# Patient Record
Sex: Female | Born: 1940
Health system: Southern US, Community
[De-identification: ages and names within clinical notes are randomized; demographics above are authoritative.]

## PROBLEM LIST (undated history)

## (undated) DIAGNOSIS — E7800 Pure hypercholesterolemia, unspecified: Secondary | ICD-10-CM

## (undated) DIAGNOSIS — I10 Essential (primary) hypertension: Secondary | ICD-10-CM

## (undated) HISTORY — PX: TUBAL LIGATION: SHX77

---

## 2005-12-21 ENCOUNTER — Other Ambulatory Visit: Payer: Self-pay

## 2005-12-21 ENCOUNTER — Emergency Department: Payer: Self-pay | Admitting: Unknown Physician Specialty

## 2012-04-07 ENCOUNTER — Ambulatory Visit: Payer: Self-pay | Admitting: Emergency Medicine

## 2012-04-21 ENCOUNTER — Telehealth: Payer: Self-pay

## 2012-04-21 ENCOUNTER — Other Ambulatory Visit: Payer: Self-pay

## 2012-04-21 DIAGNOSIS — R932 Abnormal findings on diagnostic imaging of liver and biliary tract: Secondary | ICD-10-CM

## 2012-04-21 NOTE — Telephone Encounter (Signed)
Pt has been instructed and meds reviewed she was just started on Metformin yesterday and I advised her to NOT take it on the day of the procedure.  Pt agreed and will call with any questions or concerns.  Instructions were mailed to the home

## 2012-04-21 NOTE — Telephone Encounter (Signed)
Pt has been scheduled for EUS needs to be instructed and meds reviewed  Left message on machine to call back

## 2012-05-04 ENCOUNTER — Ambulatory Visit (HOSPITAL_COMMUNITY)
Admission: RE | Admit: 2012-05-04 | Discharge: 2012-05-04 | Disposition: A | Discharge status: Home Health | Payer: Medicare Other | Source: Ambulatory Visit | Attending: Gastroenterology | Admitting: Gastroenterology

## 2012-05-04 ENCOUNTER — Encounter (HOSPITAL_COMMUNITY): Payer: Self-pay

## 2012-05-04 ENCOUNTER — Encounter (HOSPITAL_COMMUNITY): Payer: Self-pay | Admitting: *Deleted

## 2012-05-04 ENCOUNTER — Encounter (HOSPITAL_COMMUNITY)
Admission: RE | Disposition: A | Discharge status: Home Health | Payer: Self-pay | Source: Ambulatory Visit | Attending: Gastroenterology

## 2012-05-04 ENCOUNTER — Ambulatory Visit (HOSPITAL_COMMUNITY): Payer: Medicare Other | Admitting: *Deleted

## 2012-05-04 DIAGNOSIS — R1110 Vomiting, unspecified: Secondary | ICD-10-CM | POA: Insufficient documentation

## 2012-05-04 DIAGNOSIS — R197 Diarrhea, unspecified: Secondary | ICD-10-CM | POA: Insufficient documentation

## 2012-05-04 DIAGNOSIS — E119 Type 2 diabetes mellitus without complications: Secondary | ICD-10-CM | POA: Insufficient documentation

## 2012-05-04 DIAGNOSIS — E7800 Pure hypercholesterolemia, unspecified: Secondary | ICD-10-CM | POA: Insufficient documentation

## 2012-05-04 DIAGNOSIS — R109 Unspecified abdominal pain: Secondary | ICD-10-CM | POA: Insufficient documentation

## 2012-05-04 DIAGNOSIS — R932 Abnormal findings on diagnostic imaging of liver and biliary tract: Secondary | ICD-10-CM | POA: Insufficient documentation

## 2012-05-04 DIAGNOSIS — R933 Abnormal findings on diagnostic imaging of other parts of digestive tract: Secondary | ICD-10-CM

## 2012-05-04 DIAGNOSIS — I10 Essential (primary) hypertension: Secondary | ICD-10-CM | POA: Insufficient documentation

## 2012-05-04 HISTORY — DX: Pure hypercholesterolemia, unspecified: E78.00

## 2012-05-04 HISTORY — PX: EUS: SHX5427

## 2012-05-04 LAB — GLUCOSE, CAPILLARY: Glucose-Capillary: 97 mg/dL (ref 70–99)

## 2012-05-04 SURGERY — UPPER ENDOSCOPIC ULTRASOUND (EUS) LINEAR
Anesthesia: Monitor Anesthesia Care

## 2012-05-04 MED ORDER — FENTANYL CITRATE 0.05 MG/ML IJ SOLN
INTRAMUSCULAR | Status: DC | PRN
  Administered 2012-05-04: 50 ug via INTRAVENOUS

## 2012-05-04 MED ORDER — KETAMINE HCL 10 MG/ML IJ SOLN
INTRAMUSCULAR | Status: DC | PRN
  Administered 2012-05-04: 10 mg via INTRAVENOUS

## 2012-05-04 MED ORDER — PROPOFOL 10 MG/ML IV EMUL
INTRAVENOUS | Status: DC | PRN
  Administered 2012-05-04: 160 ug/kg/min via INTRAVENOUS

## 2012-05-04 MED ORDER — ONDANSETRON HCL 4 MG/2ML IJ SOLN
INTRAMUSCULAR | Status: DC | PRN
  Administered 2012-05-04: 4 mg via INTRAVENOUS

## 2012-05-04 MED ORDER — MIDAZOLAM HCL 5 MG/5ML IJ SOLN
INTRAMUSCULAR | Status: DC | PRN
  Administered 2012-05-04 (×2): 1 mg via INTRAVENOUS

## 2012-05-04 MED ORDER — LACTATED RINGERS IV SOLN
INTRAVENOUS | Status: DC | PRN
  Administered 2012-05-04: 13:00:00 via INTRAVENOUS

## 2012-05-04 NOTE — Anesthesia Postprocedure Evaluation (Signed)
Anesthesia Post Note  Patient: Joan Brady  Procedure(s) Performed: Procedure(s) (LRB): UPPER ENDOSCOPIC ULTRASOUND (EUS) LINEAR (N/A)  Anesthesia type: MAC  Patient location: PACU  Post pain: Pain level controlled  Post assessment: Post-op Vital signs reviewed  Last Vitals: BP 141/72  Pulse 72  Temp 36.7 C  Resp 13  Ht 5\' 3"  (1.6 m)  Wt 140 lb (63.504 kg)  BMI 24.80 kg/m2  SpO2 100%  Post vital signs: Reviewed  Level of consciousness: awake  Complications: No apparent anesthesia complications

## 2012-05-04 NOTE — Transfer of Care (Signed)
Immediate Anesthesia Transfer of Care Note  Patient: Joan Brady  Procedure(s) Performed: Procedure(s) (LRB): UPPER ENDOSCOPIC ULTRASOUND (EUS) LINEAR (N/A)  Patient Location: PACU and Endoscopy Unit  Anesthesia Type: MAC  Level of Consciousness: awake, alert , oriented and patient cooperative  Airway & Oxygen Therapy: Patient Spontanous Breathing and Patient connected to nasal cannula oxygen  Post-op Assessment: Report given to PACU RN, Post -op Vital signs reviewed and stable and Patient moving all extremities  Post vital signs: Reviewed and stable  Complications: No apparent anesthesia complications

## 2012-05-04 NOTE — Preoperative (Signed)
Beta Blockers   Reason not to administer Beta Blockers:Not Applicable 

## 2012-05-04 NOTE — Anesthesia Preprocedure Evaluation (Addendum)
Anesthesia Evaluation  Patient identified by MRN, date of birth, ID band Patient awake    Reviewed: Allergy & Precautions, H&P , NPO status , Patient's Chart, lab work & pertinent test results  Airway Mallampati: II TM Distance: >3 FB Neck ROM: Full    Dental  (+) Edentulous Upper and Edentulous Lower   Pulmonary Current Smoker,  breath sounds clear to auscultation  Pulmonary exam normal       Cardiovascular Exercise Tolerance: Good hypertension, Pt. on medications Rhythm:Regular Rate:Normal     Neuro/Psych negative neurological ROS  negative psych ROS   GI/Hepatic negative GI ROS, Neg liver ROS,   Endo/Other  Type 2  Renal/GU negative Renal ROS     Musculoskeletal   Abdominal (+)  Abdomen: soft.    Peds  Hematology   Anesthesia Other Findings   Reproductive/Obstetrics                         Anesthesia Physical Anesthesia Plan  ASA: III  Anesthesia Plan: MAC   Post-op Pain Management:    Induction: Intravenous  Airway Management Planned: Simple Face Mask  Additional Equipment:   Intra-op Plan:   Post-operative Plan:   Informed Consent: I have reviewed the patients History and Physical, chart, labs and discussed the procedure including the risks, benefits and alternatives for the proposed anesthesia with the patient or authorized representative who has indicated his/her understanding and acceptance.   Dental advisory given  Plan Discussed with: CRNA and Surgeon  Anesthesia Plan Comments:         Anesthesia Quick Evaluation

## 2012-05-04 NOTE — H&P (Signed)
  HPI: This is a with abd pain, diarhrea, vomiting that has all improved since changing her DM meds.  Workup revealed "potentially abnormal pancreas" on outside CT and MRI    Past Medical History  Diagnosis Date  . Hypercholesteremia   . Diabetes mellitus     Past Surgical History  Procedure Date  . Tubal ligation     No current facility-administered medications for this encounter.   Facility-Administered Medications Ordered in Other Encounters  Medication Dose Route Frequency Provider Last Rate Last Dose  . lactated ringers infusion    Continuous PRN Edison Pace, CRNA        Allergies as of 04/21/2012  . (Not on File)    History reviewed. No pertinent family history.  History   Social History  . Marital Status: Divorced    Spouse Name: N/A    Number of Children: N/A  . Years of Education: N/A   Occupational History  . Not on file.   Social History Main Topics  . Smoking status: Current Everyday Smoker -- 0.5 packs/day  . Smokeless tobacco: Not on file  . Alcohol Use: No  . Drug Use: No  . Sexually Active:    Other Topics Concern  . Not on file   Social History Narrative  . No narrative on file      Physical Exam: BP 155/84  Pulse 72  Temp 98 F (36.7 C)  Resp 16  Ht 5\' 3"  (1.6 m)  Wt 140 lb (63.504 kg)  BMI 24.80 kg/m2  SpO2 99% Constitutional: generally well-appearing Psychiatric: alert and oriented x3 Abdomen: soft, nontender, nondistended, no obvious ascites, no peritoneal signs, normal bowel sounds     Assessment and plan: 71 y.o. female with ? Abnormal pancreas on outside imaging  For EUS today

## 2012-05-04 NOTE — Op Note (Signed)
[facility] [address] [place], [place]  [address]  ENDOSCOPIC ULTRASOUND PROCEDURE REPORT  PATIENT:  Joan Brady, Joan Brady  MR#:  [id number] BIRTHDATE:  08/05/1941  GENDER:  female ENDOSCOPIST:  Rachael Fee, MD REFERRING:   Dr. Niel Hummer at [facility] in [place], [place] PROCEDURE DATE:  05/04/2012 PROCEDURE:  Upper EUS ASA CLASS:  Class II INDICATIONS:  recent GI symptoms occuring after DM medicine adjustments, have improved; workup during that revealed abnormal pancreas on CT, MRI (outside).  No history of pancreatic disease, no FH of pancreatic disease, never etoh drinker MEDICATIONS:   MAC sedation, administered by CRNA  DESCRIPTION OF PROCEDURE:   After the risks benefits and alternatives of the procedure were  explained, informed consent was obtained. The patient was then placed in the left, lateral, decubitus postion and IV sedation was administered. Throughout the procedure, the patient's blood pressure, pulse and oxygen saturations were monitored continuously.  Under direct visualization, the 110036 endoscope was introduced through the mouth and advanced to the second portion of the duodenum.  Water was used as necessary to provide an acoustic interface.  Upon completion of the imaging, water was removed and the patient was sent to the recovery room in satisfactory condition. <<PROCEDUREIMAGES>>  Endoscopic findings: 1. Normal UGI tract  EUS findings: 1. Pancreaic parenchyma was normal throughout gland; no discrete masses, no signs of chronic pancreatitis 2. Main pancreatic duct was normal 3. No peripanceatic adenopathy 4. Gallbladder was normal 5. CBD was non-dilated and contained no stone 6. Limited views of liver, spleen, portal and splenic vessels were all normal  Impression: Normal examination of pancreas.  The GI symptoms that led to initial evaluation have all subsided and may have been related to DM medicine  adjustments.  ______________________________ Rachael Fee, MD  n. eSIGNED:   Rachael Fee at 05/04/2012 02:59 PM  Barnet Pall, [phone]

## 2012-05-05 ENCOUNTER — Encounter (HOSPITAL_COMMUNITY): Payer: Self-pay | Admitting: Gastroenterology

## 2014-10-23 DIAGNOSIS — F17211 Nicotine dependence, cigarettes, in remission: Secondary | ICD-10-CM | POA: Diagnosis not present

## 2014-10-23 DIAGNOSIS — Z9114 Patient's other noncompliance with medication regimen: Secondary | ICD-10-CM | POA: Diagnosis not present

## 2014-10-23 DIAGNOSIS — E1165 Type 2 diabetes mellitus with hyperglycemia: Secondary | ICD-10-CM | POA: Diagnosis not present

## 2014-10-23 DIAGNOSIS — E781 Pure hyperglyceridemia: Secondary | ICD-10-CM | POA: Diagnosis not present

## 2014-10-23 DIAGNOSIS — I10 Essential (primary) hypertension: Secondary | ICD-10-CM | POA: Diagnosis not present

## 2015-02-19 DIAGNOSIS — H524 Presbyopia: Secondary | ICD-10-CM | POA: Diagnosis not present

## 2015-02-19 DIAGNOSIS — H5210 Myopia, unspecified eye: Secondary | ICD-10-CM | POA: Diagnosis not present

## 2015-05-12 ENCOUNTER — Encounter: Payer: Self-pay | Admitting: Family Medicine

## 2015-06-12 ENCOUNTER — Encounter: Payer: Self-pay | Admitting: Family Medicine

## 2015-07-02 ENCOUNTER — Encounter: Payer: Self-pay | Admitting: Family Medicine

## 2015-08-15 ENCOUNTER — Other Ambulatory Visit: Payer: Self-pay | Admitting: Family Medicine

## 2015-08-21 ENCOUNTER — Other Ambulatory Visit: Payer: Self-pay | Admitting: Family Medicine

## 2015-08-28 DIAGNOSIS — I10 Essential (primary) hypertension: Secondary | ICD-10-CM | POA: Diagnosis not present

## 2015-08-28 DIAGNOSIS — E7800 Pure hypercholesterolemia, unspecified: Secondary | ICD-10-CM | POA: Diagnosis not present

## 2015-08-28 DIAGNOSIS — Z23 Encounter for immunization: Secondary | ICD-10-CM | POA: Diagnosis not present

## 2015-08-28 DIAGNOSIS — E1165 Type 2 diabetes mellitus with hyperglycemia: Secondary | ICD-10-CM | POA: Diagnosis not present

## 2015-09-02 DIAGNOSIS — E1165 Type 2 diabetes mellitus with hyperglycemia: Secondary | ICD-10-CM | POA: Diagnosis not present

## 2015-09-02 DIAGNOSIS — E7800 Pure hypercholesterolemia, unspecified: Secondary | ICD-10-CM | POA: Diagnosis not present

## 2015-09-02 DIAGNOSIS — I10 Essential (primary) hypertension: Secondary | ICD-10-CM | POA: Diagnosis not present

## 2016-03-04 DIAGNOSIS — E1165 Type 2 diabetes mellitus with hyperglycemia: Secondary | ICD-10-CM | POA: Diagnosis not present

## 2016-03-04 DIAGNOSIS — I10 Essential (primary) hypertension: Secondary | ICD-10-CM | POA: Diagnosis not present

## 2016-03-04 DIAGNOSIS — E7800 Pure hypercholesterolemia, unspecified: Secondary | ICD-10-CM | POA: Diagnosis not present

## 2016-03-04 DIAGNOSIS — Z79899 Other long term (current) drug therapy: Secondary | ICD-10-CM | POA: Diagnosis not present

## 2016-11-22 DIAGNOSIS — Z79899 Other long term (current) drug therapy: Secondary | ICD-10-CM | POA: Diagnosis not present

## 2016-11-22 DIAGNOSIS — E1165 Type 2 diabetes mellitus with hyperglycemia: Secondary | ICD-10-CM | POA: Diagnosis not present

## 2016-11-22 DIAGNOSIS — E7800 Pure hypercholesterolemia, unspecified: Secondary | ICD-10-CM | POA: Diagnosis not present

## 2016-11-22 DIAGNOSIS — I10 Essential (primary) hypertension: Secondary | ICD-10-CM | POA: Diagnosis not present

## 2017-05-18 DIAGNOSIS — Z0000 Encounter for general adult medical examination without abnormal findings: Secondary | ICD-10-CM | POA: Diagnosis not present

## 2017-05-18 DIAGNOSIS — E1165 Type 2 diabetes mellitus with hyperglycemia: Secondary | ICD-10-CM | POA: Diagnosis not present

## 2017-05-18 DIAGNOSIS — Z79899 Other long term (current) drug therapy: Secondary | ICD-10-CM | POA: Diagnosis not present

## 2017-12-01 DIAGNOSIS — R809 Proteinuria, unspecified: Secondary | ICD-10-CM | POA: Diagnosis not present

## 2017-12-01 DIAGNOSIS — E7800 Pure hypercholesterolemia, unspecified: Secondary | ICD-10-CM | POA: Diagnosis not present

## 2017-12-01 DIAGNOSIS — I10 Essential (primary) hypertension: Secondary | ICD-10-CM | POA: Diagnosis not present

## 2017-12-01 DIAGNOSIS — Z23 Encounter for immunization: Secondary | ICD-10-CM | POA: Diagnosis not present

## 2017-12-01 DIAGNOSIS — Z79899 Other long term (current) drug therapy: Secondary | ICD-10-CM | POA: Diagnosis not present

## 2017-12-01 DIAGNOSIS — E1129 Type 2 diabetes mellitus with other diabetic kidney complication: Secondary | ICD-10-CM | POA: Diagnosis not present

## 2017-12-01 DIAGNOSIS — E663 Overweight: Secondary | ICD-10-CM | POA: Diagnosis not present

## 2017-12-02 DIAGNOSIS — E119 Type 2 diabetes mellitus without complications: Secondary | ICD-10-CM | POA: Diagnosis not present

## 2017-12-08 ENCOUNTER — Inpatient Hospital Stay
Admission: EM | Admit: 2017-12-08 | Discharge: 2017-12-10 | DRG: 069 | Disposition: A | Discharge status: Home / Self Care | Payer: Medicare HMO | Attending: Internal Medicine | Admitting: Internal Medicine

## 2017-12-08 ENCOUNTER — Other Ambulatory Visit: Payer: Self-pay

## 2017-12-08 ENCOUNTER — Encounter: Payer: Self-pay | Admitting: Emergency Medicine

## 2017-12-08 ENCOUNTER — Emergency Department: Payer: Medicare HMO

## 2017-12-08 DIAGNOSIS — R1110 Vomiting, unspecified: Secondary | ICD-10-CM | POA: Diagnosis not present

## 2017-12-08 DIAGNOSIS — M47812 Spondylosis without myelopathy or radiculopathy, cervical region: Secondary | ICD-10-CM | POA: Diagnosis not present

## 2017-12-08 DIAGNOSIS — Y92009 Unspecified place in unspecified non-institutional (private) residence as the place of occurrence of the external cause: Secondary | ICD-10-CM

## 2017-12-08 DIAGNOSIS — S8001XA Contusion of right knee, initial encounter: Secondary | ICD-10-CM | POA: Diagnosis present

## 2017-12-08 DIAGNOSIS — E11649 Type 2 diabetes mellitus with hypoglycemia without coma: Secondary | ICD-10-CM | POA: Diagnosis not present

## 2017-12-08 DIAGNOSIS — Z800 Family history of malignant neoplasm of digestive organs: Secondary | ICD-10-CM | POA: Diagnosis not present

## 2017-12-08 DIAGNOSIS — R51 Headache: Secondary | ICD-10-CM | POA: Diagnosis not present

## 2017-12-08 DIAGNOSIS — Z818 Family history of other mental and behavioral disorders: Secondary | ICD-10-CM | POA: Diagnosis not present

## 2017-12-08 DIAGNOSIS — Y9301 Activity, walking, marching and hiking: Secondary | ICD-10-CM | POA: Diagnosis present

## 2017-12-08 DIAGNOSIS — R29700 NIHSS score 0: Secondary | ICD-10-CM | POA: Diagnosis present

## 2017-12-08 DIAGNOSIS — E785 Hyperlipidemia, unspecified: Secondary | ICD-10-CM | POA: Diagnosis present

## 2017-12-08 DIAGNOSIS — Z8249 Family history of ischemic heart disease and other diseases of the circulatory system: Secondary | ICD-10-CM | POA: Diagnosis not present

## 2017-12-08 DIAGNOSIS — G8191 Hemiplegia, unspecified affecting right dominant side: Secondary | ICD-10-CM | POA: Diagnosis present

## 2017-12-08 DIAGNOSIS — F17200 Nicotine dependence, unspecified, uncomplicated: Secondary | ICD-10-CM | POA: Diagnosis present

## 2017-12-08 DIAGNOSIS — I447 Left bundle-branch block, unspecified: Secondary | ICD-10-CM | POA: Diagnosis present

## 2017-12-08 DIAGNOSIS — Z833 Family history of diabetes mellitus: Secondary | ICD-10-CM

## 2017-12-08 DIAGNOSIS — E162 Hypoglycemia, unspecified: Secondary | ICD-10-CM | POA: Diagnosis not present

## 2017-12-08 DIAGNOSIS — I6523 Occlusion and stenosis of bilateral carotid arteries: Secondary | ICD-10-CM | POA: Diagnosis not present

## 2017-12-08 DIAGNOSIS — E7800 Pure hypercholesterolemia, unspecified: Secondary | ICD-10-CM | POA: Diagnosis not present

## 2017-12-08 DIAGNOSIS — Z7984 Long term (current) use of oral hypoglycemic drugs: Secondary | ICD-10-CM | POA: Diagnosis not present

## 2017-12-08 DIAGNOSIS — I10 Essential (primary) hypertension: Secondary | ICD-10-CM | POA: Diagnosis present

## 2017-12-08 DIAGNOSIS — Z888 Allergy status to other drugs, medicaments and biological substances status: Secondary | ICD-10-CM | POA: Diagnosis not present

## 2017-12-08 DIAGNOSIS — Z79899 Other long term (current) drug therapy: Secondary | ICD-10-CM | POA: Diagnosis not present

## 2017-12-08 DIAGNOSIS — Z823 Family history of stroke: Secondary | ICD-10-CM | POA: Diagnosis not present

## 2017-12-08 DIAGNOSIS — S8002XA Contusion of left knee, initial encounter: Secondary | ICD-10-CM | POA: Diagnosis present

## 2017-12-08 DIAGNOSIS — K5900 Constipation, unspecified: Secondary | ICD-10-CM | POA: Diagnosis not present

## 2017-12-08 DIAGNOSIS — R531 Weakness: Secondary | ICD-10-CM

## 2017-12-08 DIAGNOSIS — E119 Type 2 diabetes mellitus without complications: Secondary | ICD-10-CM

## 2017-12-08 DIAGNOSIS — G459 Transient cerebral ischemic attack, unspecified: Principal | ICD-10-CM | POA: Diagnosis present

## 2017-12-08 DIAGNOSIS — R410 Disorientation, unspecified: Secondary | ICD-10-CM | POA: Diagnosis not present

## 2017-12-08 HISTORY — DX: Essential (primary) hypertension: I10

## 2017-12-08 LAB — CBC
HEMATOCRIT: 39.4 % (ref 35.0–47.0)
HEMOGLOBIN: 13.1 g/dL (ref 12.0–16.0)
MCH: 30.4 pg (ref 26.0–34.0)
MCHC: 33.2 g/dL (ref 32.0–36.0)
MCV: 91.6 fL (ref 80.0–100.0)
Platelets: 233 10*3/uL (ref 150–440)
RBC: 4.3 MIL/uL (ref 3.80–5.20)
RDW: 14.1 % (ref 11.5–14.5)
WBC: 8.6 10*3/uL (ref 3.6–11.0)

## 2017-12-08 LAB — DIFFERENTIAL
BASOS ABS: 0 10*3/uL (ref 0–0.1)
Basophils Relative: 0 %
Eosinophils Absolute: 0 10*3/uL (ref 0–0.7)
Eosinophils Relative: 0 %
LYMPHS PCT: 17 %
Lymphs Abs: 1.4 10*3/uL (ref 1.0–3.6)
MONO ABS: 0.6 10*3/uL (ref 0.2–0.9)
MONOS PCT: 7 %
Neutro Abs: 6.6 10*3/uL — ABNORMAL HIGH (ref 1.4–6.5)
Neutrophils Relative %: 76 %

## 2017-12-08 LAB — TROPONIN I: TROPONIN I: 0.04 ng/mL — AB (ref <0.03)

## 2017-12-08 LAB — COMPREHENSIVE METABOLIC PANEL
ALK PHOS: 39 U/L (ref 38–126)
ALT: 26 U/L (ref 14–54)
AST: 57 U/L — AB (ref 15–41)
Albumin: 3.5 g/dL (ref 3.5–5.0)
Anion gap: 11 (ref 5–15)
BUN: 16 mg/dL (ref 6–20)
CALCIUM: 9 mg/dL (ref 8.9–10.3)
CO2: 24 mmol/L (ref 22–32)
CREATININE: 0.78 mg/dL (ref 0.44–1.00)
Chloride: 101 mmol/L (ref 101–111)
Glucose, Bld: 75 mg/dL (ref 65–99)
Potassium: 3.8 mmol/L (ref 3.5–5.1)
Sodium: 136 mmol/L (ref 135–145)
Total Bilirubin: 0.6 mg/dL (ref 0.3–1.2)
Total Protein: 7.8 g/dL (ref 6.5–8.1)

## 2017-12-08 LAB — GLUCOSE, CAPILLARY
Glucose-Capillary: 83 mg/dL (ref 65–99)
Glucose-Capillary: 63 mg/dL — ABNORMAL LOW (ref 65–99)

## 2017-12-08 LAB — PROTIME-INR
INR: 0.91
Prothrombin Time: 12.2 seconds (ref 11.4–15.2)

## 2017-12-08 LAB — ETHANOL

## 2017-12-08 LAB — APTT: APTT: 31 s (ref 24–36)

## 2017-12-08 MED ORDER — DEXTROSE-NACL 5-0.45 % IV SOLN
INTRAVENOUS | Status: DC
  Administered 2017-12-09: 01:00:00 via INTRAVENOUS

## 2017-12-08 MED ORDER — ASPIRIN EC 325 MG PO TBEC
325.0000 mg | DELAYED_RELEASE_TABLET | Freq: Once | ORAL | Status: AC
  Administered 2017-12-08: 325 mg via ORAL

## 2017-12-08 MED ORDER — ASPIRIN EC 325 MG PO TBEC
DELAYED_RELEASE_TABLET | ORAL | Status: AC
  Administered 2017-12-08: 325 mg via ORAL
  Filled 2017-12-08: qty 1

## 2017-12-08 MED ORDER — DEXTROSE 50 % IV SOLN
1.0000 | Freq: Once | INTRAVENOUS | Status: AC
  Administered 2017-12-08: 50 mL via INTRAVENOUS
  Filled 2017-12-08: qty 50

## 2017-12-08 NOTE — ED Notes (Signed)
Pt resting on stretcher with family at the bedside. Denies pain. Answering questions without difficulty. Talkative and smiling at this time.

## 2017-12-08 NOTE — ED Notes (Signed)
MD at the bedside for pt evaluation  

## 2017-12-08 NOTE — ED Triage Notes (Addendum)
Pt arrived to ED via EMS from home with c/o weakness and hypoglycemia. Pt was found on floor by daughter with FSBS of 50 per EMS. Slightly confused upon their arrival. Oral glucose given by EMS resulting in FSBS of 79. Pt alert and talkative at this time. States she had a headache 2 days ago and noticed right sided weakness and numbness around 8am. Resolved this afternoon. Has not felt like eating all day.

## 2017-12-08 NOTE — H&P (Signed)
[facility] at [facility]   PATIENT NAME: Joan Brady    MR#:  [id number]  DATE OF BIRTH:  July 15, 1941  DATE OF ADMISSION:  12/08/2017  PRIMARY CARE PHYSICIAN: Bobby Rumpf, MD   REQUESTING/REFERRING PHYSICIAN: Don Perking, MD  CHIEF COMPLAINT:   Chief Complaint  Patient presents with  . Weakness  . Hypoglycemia  . Numbness    HISTORY OF PRESENT ILLNESS:  Joan Brady  is a 77 y.o. female who presents with 2 episodes of right sided hemiparesis.  Patient states that 2 days ago she woke up and was unable to use her right leg or right arm.  She states the symptoms resolved throughout the day.  She did not seek any medical attention at that time.  Yesterday she was "normal" per her report.  This morning when she woke up she again had report of this right-sided hemiparesis, with some hemisensory deficit as well.  She did not seek immediate medical attention, but as the symptoms persisted through the majority of the day she did come to the ED this evening.  At this time her symptoms have resolved again.  Initial workup in the ED is largely within normal limits, hospitalist were called for admission  PAST MEDICAL HISTORY:   Past Medical History:  Diagnosis Date  . Diabetes mellitus   . Hypercholesteremia   . Hypertension      PAST SURGICAL HISTORY:   Past Surgical History:  Procedure Laterality Date  . EUS  05/04/2012   Procedure: UPPER ENDOSCOPIC ULTRASOUND (EUS) LINEAR;  Surgeon: Rachael Fee, MD;  Location: [facility] ENDOSCOPY;  Service: Endoscopy;  Laterality: N/A;  radial linear   . TUBAL LIGATION       SOCIAL HISTORY:   Social History   Tobacco Use  . Smoking status: Current Every Day Smoker    Packs/day: 0.50  . Smokeless tobacco: Never Used  Substance Use Topics  . Alcohol use: No     FAMILY HISTORY:   Family History  Problem Relation Age of Onset  . Depression Mother   . Diabetes Mother   . Hypertension Father   . Heart  attack Father      DRUG ALLERGIES:   Allergies  Allergen Reactions  . Metformin Nausea Only  . Sitagliptin Nausea Only    MEDICATIONS AT HOME:   Prior to Admission medications   Medication Sig Start Date End Date Taking? Authorizing Provider  atorvastatin (LIPITOR) 40 MG tablet Take 40 mg by mouth every evening.   Yes [provider]  GLIPIZIDE XL 10 MG 24 hr tablet Take 10 mg by mouth 2 (two) times daily.  09/05/17  Yes [provider]  lisinopril (PRINIVIL,ZESTRIL) 10 MG tablet TAKE ONE TABLET BY MOUTH ONCE DAILY 08/18/15  Yes Duanne Limerick, MD  pioglitazone (ACTOS) 30 MG tablet Take 30 mg by mouth daily. 11/23/17  Yes [provider]    REVIEW OF SYSTEMS:  Review of Systems  Constitutional: Negative for chills, fever, malaise/fatigue and weight loss.  HENT: Negative for ear pain, hearing loss and tinnitus.   Eyes: Negative for blurred vision, double vision, pain and redness.  Respiratory: Negative for cough, hemoptysis and shortness of breath.   Cardiovascular: Negative for chest pain, palpitations, orthopnea and leg swelling.  Gastrointestinal: Negative for abdominal pain, constipation, diarrhea, nausea and vomiting.  Genitourinary: Negative for dysuria, frequency and hematuria.  Musculoskeletal: Negative for back pain, joint pain and neck pain.  Skin:  No acne, rash, or lesions  Neurological: Positive for sensory change and focal weakness. Negative for dizziness, tremors and weakness.  Endo/Heme/Allergies: Negative for polydipsia. Does not bruise/bleed easily.  Psychiatric/Behavioral: Negative for depression. The patient is not nervous/anxious and does not have insomnia.      VITAL SIGNS:   Vitals:   12/08/17 2100 12/08/17 2133 12/08/17 2200 12/08/17 2230  BP: (!) 165/87 (!) 181/70  (!) 175/129  Pulse: 86 75  62  Resp: (!) 26 17  (!) 21  Temp:   98.3 F (36.8 C)   TempSrc:      SpO2: 99% 100%  100%  Weight: 73.5 kg (162 lb)      Height: 5\' 4"  (1.626 m)      Wt Readings from Last 3 Encounters:  12/08/17 73.5 kg (162 lb)  05/04/12 63.5 kg (140 lb)    PHYSICAL EXAMINATION:  Physical Exam  Vitals reviewed. Constitutional: She is oriented to person, place, and time. She appears well-developed and well-nourished. No distress.  HENT:  Head: Normocephalic and atraumatic.  Mouth/Throat: Oropharynx is clear and moist.  Eyes: Pupils are equal, round, and reactive to light. Conjunctivae and EOM are normal. No scleral icterus.  Neck: Normal range of motion. Neck supple. No JVD present. No thyromegaly present.  Cardiovascular: Normal rate, regular rhythm and intact distal pulses. Exam reveals no gallop and no friction rub.  No murmur heard. Respiratory: Effort normal and breath sounds normal. No respiratory distress. She has no wheezes. She has no rales.  GI: Soft. Bowel sounds are normal. She exhibits no distension. There is no tenderness.  Musculoskeletal: Normal range of motion. She exhibits no edema.  No arthritis, no gout  Lymphadenopathy:    She has no cervical adenopathy.  Neurological: She is alert and oriented to person, place, and time. No cranial nerve deficit.  Neurologic: Cranial nerves II-XII intact, Sensation intact to light touch/pinprick, 5/5 strength in all extremities except right lower extremity with 4/5 strength, no dysarthria, no aphasia, no dysphagia, memory intact, finger to nose testing showed no abnormality, no pronator drift   Skin: Skin is warm and dry. No rash noted. No erythema.  Psychiatric: She has a normal mood and affect. Her behavior is normal. Judgment and thought content normal.    LABORATORY PANEL:   CBC Recent Labs  Lab 12/08/17 2112  WBC 8.6  HGB 13.1  HCT 39.4  PLT 233   ------------------------------------------------------------------------------------------------------------------  Chemistries  Recent Labs  Lab 12/08/17 2112  NA 136  K 3.8  CL 101  CO2 24   GLUCOSE 75  BUN 16  CREATININE 0.78  CALCIUM 9.0  AST 57*  ALT 26  ALKPHOS 39  BILITOT 0.6   ------------------------------------------------------------------------------------------------------------------  Cardiac Enzymes Recent Labs  Lab 12/08/17 2112  TROPONINI 0.04*   ------------------------------------------------------------------------------------------------------------------  RADIOLOGY:  Ct Head Wo Contrast  Result Date: 12/08/2017 CLINICAL DATA:  RIGHT-sided weakness for 2 days.  Denies trauma. EXAM: CT HEAD WITHOUT CONTRAST TECHNIQUE: Contiguous axial images were obtained from the base of the skull through the vertex without intravenous contrast. COMPARISON:  CT head 12/21/2005. FINDINGS: Brain: No evidence of acute infarction, hemorrhage, hydrocephalus, extra-axial collection or mass lesion/mass effect. Mild atrophy. Hypoattenuation of white matter, likely small vessel disease. Vascular: Calcification of the cavernous internal carotid arteries consistent with cerebrovascular atherosclerotic disease. No signs of intracranial large vessel occlusion. Skull: Calvarium intact. Sinuses/Orbits: No acute finding. Other: Empty sella with expansion, uncertain significance. Compared with priors, similar appearance. IMPRESSION: No intracranial abnormality can  be identified as a cause of acute RIGHT-sided weakness. Cerebrovascular atherosclerotic disease without signs of large vessel occlusion. Empty sella with expansion, uncertain significance. Electronically Signed   By: Elsie StainJohn T Curnes M.D.   On: 12/08/2017 21:41    EKG:   Orders placed or performed during the hospital encounter of 12/08/17  . ED EKG  . ED EKG    IMPRESSION AND PLAN:  Principal Problem:   Right sided weakness -unclear etiology, though her clinical story is very consistent with possible stroke pathology.  Symptoms have resolved now except for some minor persistent right lower extremity weakness.  Will admit her  per stroke admission order set with appropriate imaging, labs, and consults. Active Problems:   HTN (hypertension) -permissive hypertension until tomorrow morning, blood pressure goal less than 220/120, hold antihypertensives otherwise   Diabetes (HCC) -the patient's blood glucose has been low as she is normally on 24-hour glipizide at home.  We will have her on some D5 half-normal saline with q. 2-hour glucose checks for now as she did require D50 in the ED   HLD (hyperlipidemia) -home dose statin  Chart review performed and case discussed with ED provider. Labs, imaging and/or ECG reviewed by provider and discussed with patient/family. Management plans discussed with the patient and/or family.  DVT PROPHYLAXIS: SubQ lovenox  GI PROPHYLAXIS: None  ADMISSION STATUS: Observation  CODE STATUS: Full  TOTAL TIME TAKING CARE OF THIS PATIENT: 40 minutes.   Duong Haydel FIELDING 12/08/2017, 10:53 PM  [facility]  Office  [phone]  CC: Primary care physician; Bobby Rumpfarew, Khary, MD  Note:  This document was prepared using Dragon voice recognition software and may include unintentional dictation errors.

## 2017-12-08 NOTE — ED Notes (Signed)
Graham crackers given to pt.

## 2017-12-08 NOTE — ED Notes (Signed)
Admitting MD at the bedside for pt evaluation.  

## 2017-12-08 NOTE — ED Notes (Signed)
Pt tolerated 1/2 the amp of d50. States it is burning in her IV. MD aware. To recheck blood sugar prior to taking pt to admission room.

## 2017-12-08 NOTE — ED Provider Notes (Signed)
[facility] Emergency Department Provider Note  ____________________________________________  Time seen: Approximately 9:10 PM  I have reviewed the triage vital signs and the nursing notes.   HISTORY  Chief Complaint Weakness; Hypoglycemia; and Numbness   HPI Joan Brady is a 77 y.o. female with a history of diabetes on oral agents, hypertension, hyperlipidemia who presents for evaluation of right-sided weakness.  Patient reports that 3 days ago she woke up with right-sided weakness.  She reports that she was unable to move the right upper and lower extremity. She sustained a fall trying to get out of bed.  She denies head trauma.  She reports that later in the morning her symptoms resolved.  Yesterday she felt back to normal.  This morning when she woke up at 8 AM the symptoms were back.  She reports that she has spent the whole day in bed because she has been unable to get up and move due to right-sided weakness and numbness.  She has not taken her medications today.  She has not eaten today because she has been in bed all day long.  This evening she reports that her symptoms resolved.  She was walking at home when she lost her balance and fell. She was on the ground for until her daughter came to check on her.  She reports that she hit her head.  She denies LOC.  She denies any weakness or numbness at this time.  No chest pain or shortness of breath, she is a smoker, no personal or family history of stroke.  When EMS arrived the patient was found to be hypoglycemic with BG 49, she was given PO glucose.   Past Medical History:  Diagnosis Date  . Diabetes mellitus   . Hypercholesteremia   . Hypertension     Patient Active Problem List   Diagnosis Date Noted  . Right sided weakness 12/08/2017  . HTN (hypertension) 12/08/2017  . HLD (hyperlipidemia) 12/08/2017  . Diabetes (HCC) 12/08/2017  . Nonspecific (abnormal) findings on radiological and other  examination of gastrointestinal tract 05/04/2012    Past Surgical History:  Procedure Laterality Date  . EUS  05/04/2012   Procedure: UPPER ENDOSCOPIC ULTRASOUND (EUS) LINEAR;  Surgeon: Rachael Fee, MD;  Location: [facility] ENDOSCOPY;  Service: Endoscopy;  Laterality: N/A;  radial linear   . TUBAL LIGATION      Prior to Admission medications   Medication Sig Start Date End Date Taking? Authorizing Provider  atorvastatin (LIPITOR) 40 MG tablet Take 40 mg by mouth every evening.   Yes [provider]  GLIPIZIDE XL 10 MG 24 hr tablet Take 10 mg by mouth 2 (two) times daily.  09/05/17  Yes [provider]  lisinopril (PRINIVIL,ZESTRIL) 10 MG tablet TAKE ONE TABLET BY MOUTH ONCE DAILY 08/18/15  Yes Duanne Limerick, MD  pioglitazone (ACTOS) 30 MG tablet Take 30 mg by mouth daily. 11/23/17  Yes [provider]    Allergies Metformin and Sitagliptin  FH Colon cancer Brother    Stroke Brother    Diabetes type II Brother    High blood pressure (Hypertension) Father    Myocardial Infarction (Heart attack) Father    Depression Mother    Diabetes type II Mother       Social History Social History   Tobacco Use  . Smoking status: Current Every Day Smoker    Packs/day: 0.50  . Smokeless tobacco: Never Used  Substance Use Topics  . Alcohol use:  No  . Drug use: No    Review of Systems  Constitutional: Negative for fever. Eyes: Negative for visual changes. ENT: Negative for sore throat. Neck: No neck pain  Cardiovascular: Negative for chest pain. Respiratory: Negative for shortness of breath. Gastrointestinal: Negative for abdominal pain, vomiting or diarrhea. Genitourinary: Negative for dysuria. Musculoskeletal: Negative for back pain. Skin: Negative for rash. Neurological: Negative for headaches. + R sided weakness and numbness, + head trauma Psych: No SI or HI  ____________________________________________   PHYSICAL EXAM:  VITAL  SIGNS: ED Triage Vitals  Enc Vitals Group     BP 12/08/17 2041 (!) 149/80     Pulse Rate 12/08/17 2041 71     Resp 12/08/17 2041 18     Temp 12/08/17 2041 98.4 F (36.9 C)     Temp Source 12/08/17 2041 Oral     SpO2 12/08/17 2041 99 %     Weight --      Height --      Head Circumference --      Peak Flow --      Pain Score 12/08/17 2057 0     Pain Loc --      Pain Edu? --      Excl. in GC? --     Constitutional: Alert and oriented. Well appearing and in no apparent distress. HEENT:      Head: Normocephalic and atraumatic.         Eyes: Conjunctivae are normal. Sclera is non-icteric.       Mouth/Throat: Mucous membranes are moist.       Neck: Supple with no signs of meningismus. Cardiovascular: Regular rate and rhythm. No murmurs, gallops, or rubs. 2+ symmetrical distal pulses are present in all extremities. No JVD. Respiratory: Normal respiratory effort. Lungs are clear to auscultation bilaterally. No wheezes, crackles, or rhonchi.  Gastrointestinal: Soft, non tender, and non distended with positive bowel sounds. No rebound or guarding. Musculoskeletal: Nontender with normal range of motion in all extremities. No edema, cyanosis, or erythema of extremities. Neurologic: Normal speech and language. A & O x3, PERRL, EOMI, no nystagmus, CN II-XII intact, motor testing reveals good tone and bulk throughout. There is no evidence of pronator drift or dysmetria. Muscle strength is 5/5 throughout. Sensory examination is intact. Gait deferred Skin: Skin is warm, dry and intact. No rash noted. Psychiatric: Mood and affect are normal. Speech and behavior are normal.  ____________________________________________   LABS (all labs ordered are listed, but only abnormal results are displayed)  Labs Reviewed  DIFFERENTIAL - Abnormal; Notable for the following components:      Result Value   Neutro Abs 6.6 (*)    All other components within normal limits  COMPREHENSIVE METABOLIC PANEL -  Abnormal; Notable for the following components:   AST 57 (*)    All other components within normal limits  TROPONIN I - Abnormal; Notable for the following components:   Troponin I 0.04 (*)    All other components within normal limits  GLUCOSE, CAPILLARY - Abnormal; Notable for the following components:   Glucose-Capillary 63 (*)    All other components within normal limits  ETHANOL  PROTIME-INR  APTT  CBC  GLUCOSE, CAPILLARY  URINE DRUG SCREEN, QUALITATIVE ([facility] ONLY)  URINALYSIS, ROUTINE W REFLEX MICROSCOPIC   ____________________________________________  EKG  ED ECG REPORT I, Nita Sickle, the attending physician, personally viewed and interpreted this ECG.  Normal sinus rhythm, rate of 72, left bundle branch block, normal QTC, left axis  deviation, no ST elevations or depressions. LBBB is new when compared to prior from 2007 ____________________________________________  RADIOLOGY  I have personally reviewed the images performed during this visit and I agree with the Radiologist's read.   Interpretation by Radiologist:  Ct Head Wo Contrast  Result Date: 12/08/2017 CLINICAL DATA:  RIGHT-sided weakness for 2 days.  Denies trauma. EXAM: CT HEAD WITHOUT CONTRAST TECHNIQUE: Contiguous axial images were obtained from the base of the skull through the vertex without intravenous contrast. COMPARISON:  CT head 12/21/2005. FINDINGS: Brain: No evidence of acute infarction, hemorrhage, hydrocephalus, extra-axial collection or mass lesion/mass effect. Mild atrophy. Hypoattenuation of white matter, likely small vessel disease. Vascular: Calcification of the cavernous internal carotid arteries consistent with cerebrovascular atherosclerotic disease. No signs of intracranial large vessel occlusion. Skull: Calvarium intact. Sinuses/Orbits: No acute finding. Other: Empty sella with expansion, uncertain significance. Compared with priors, similar appearance. IMPRESSION: No intracranial  abnormality can be identified as a cause of acute RIGHT-sided weakness. Cerebrovascular atherosclerotic disease without signs of large vessel occlusion. Empty sella with expansion, uncertain significance. Electronically Signed   By: Elsie StainJohn T Curnes M.D.   On: 12/08/2017 21:41      ____________________________________________   PROCEDURES  Procedure(s) performed: None Procedures Critical Care performed:  None ____________________________________________   INITIAL IMPRESSION / ASSESSMENT AND PLAN / ED COURSE  77 y.o. female with a history of diabetes on oral agents, hypertension, hyperlipidemia who presents for evaluation of right-sided weakness and fall.  At this time patient is completely neurologically intact with no obvious numbness or weakness.  I am concerned the patient has been having TIAs at home.  We will do a head CT to rule out head bleed since patient has had 2 falls.  EKG showing new left bundle branch block however old EKGs from 2007.  Patient has no chest pain, shortness of breath, or angina equivalent at this time.  Troponin is pending.  Basic labs are pending.  Anticipate admission to the hospital.    _________________________ 11:43 PM on 12/08/2017 -----------------------------------------  CT head negative for stroke or trauma.  Labs show a normal white count, no evidence of anemia, dehydration, electrolyte abnormalities.  First troponin is slightly elevated at 0.04 with new LBBB but patient remains asymptomatic with no cp, sob, or anginal equivalent. Patient BG dropped again to 63, 1 amp of D50 given. Patient admitted to Hospitalist   As part of my medical decision making, I reviewed the following data within the electronic MEDICAL RECORD NUMBER Nursing notes reviewed and incorporated, Labs reviewed , EKG interpreted , Old EKG reviewed, Radiograph reviewed , Discussed with admitting physician , Notes from prior ED visits and [place] Controlled Substance Database    Pertinent labs  & imaging results that were available during my care of the patient were reviewed by me and considered in my medical decision making (see chart for details).    ____________________________________________   FINAL CLINICAL IMPRESSION(S) / ED DIAGNOSES  Final diagnoses:  TIA (transient ischemic attack)  Hypoglycemia      NEW MEDICATIONS STARTED DURING THIS VISIT:  ED Discharge Orders    None       Note:  This document was prepared using Dragon voice recognition software and may include unintentional dictation errors.    Don PerkingVeronese, [place], MD 12/08/17 [phone]

## 2017-12-09 ENCOUNTER — Observation Stay: Payer: Medicare HMO

## 2017-12-09 ENCOUNTER — Observation Stay (HOSPITAL_COMMUNITY): Payer: Medicare HMO

## 2017-12-09 ENCOUNTER — Observation Stay
Admit: 2017-12-09 | Discharge: 2017-12-09 | Disposition: A | Discharge status: Home / Self Care | Payer: Medicare HMO | Attending: Internal Medicine | Admitting: Internal Medicine

## 2017-12-09 ENCOUNTER — Other Ambulatory Visit: Payer: Self-pay

## 2017-12-09 ENCOUNTER — Inpatient Hospital Stay: Payer: Medicare HMO

## 2017-12-09 DIAGNOSIS — I447 Left bundle-branch block, unspecified: Secondary | ICD-10-CM | POA: Diagnosis present

## 2017-12-09 DIAGNOSIS — E7800 Pure hypercholesterolemia, unspecified: Secondary | ICD-10-CM | POA: Diagnosis present

## 2017-12-09 DIAGNOSIS — Z823 Family history of stroke: Secondary | ICD-10-CM | POA: Diagnosis not present

## 2017-12-09 DIAGNOSIS — R531 Weakness: Secondary | ICD-10-CM | POA: Diagnosis not present

## 2017-12-09 DIAGNOSIS — K5900 Constipation, unspecified: Secondary | ICD-10-CM | POA: Diagnosis present

## 2017-12-09 DIAGNOSIS — R410 Disorientation, unspecified: Secondary | ICD-10-CM | POA: Diagnosis not present

## 2017-12-09 DIAGNOSIS — Y92009 Unspecified place in unspecified non-institutional (private) residence as the place of occurrence of the external cause: Secondary | ICD-10-CM | POA: Diagnosis not present

## 2017-12-09 DIAGNOSIS — Y9301 Activity, walking, marching and hiking: Secondary | ICD-10-CM | POA: Diagnosis present

## 2017-12-09 DIAGNOSIS — Z79899 Other long term (current) drug therapy: Secondary | ICD-10-CM | POA: Diagnosis not present

## 2017-12-09 DIAGNOSIS — F17200 Nicotine dependence, unspecified, uncomplicated: Secondary | ICD-10-CM | POA: Diagnosis present

## 2017-12-09 DIAGNOSIS — G8191 Hemiplegia, unspecified affecting right dominant side: Secondary | ICD-10-CM | POA: Diagnosis present

## 2017-12-09 DIAGNOSIS — S8001XA Contusion of right knee, initial encounter: Secondary | ICD-10-CM | POA: Diagnosis present

## 2017-12-09 DIAGNOSIS — E785 Hyperlipidemia, unspecified: Secondary | ICD-10-CM | POA: Diagnosis present

## 2017-12-09 DIAGNOSIS — R29700 NIHSS score 0: Secondary | ICD-10-CM | POA: Diagnosis present

## 2017-12-09 DIAGNOSIS — I10 Essential (primary) hypertension: Secondary | ICD-10-CM | POA: Diagnosis present

## 2017-12-09 DIAGNOSIS — E11649 Type 2 diabetes mellitus with hypoglycemia without coma: Secondary | ICD-10-CM | POA: Diagnosis present

## 2017-12-09 DIAGNOSIS — Z888 Allergy status to other drugs, medicaments and biological substances status: Secondary | ICD-10-CM | POA: Diagnosis not present

## 2017-12-09 DIAGNOSIS — Z833 Family history of diabetes mellitus: Secondary | ICD-10-CM | POA: Diagnosis not present

## 2017-12-09 DIAGNOSIS — G459 Transient cerebral ischemic attack, unspecified: Secondary | ICD-10-CM | POA: Diagnosis present

## 2017-12-09 DIAGNOSIS — Z7984 Long term (current) use of oral hypoglycemic drugs: Secondary | ICD-10-CM | POA: Diagnosis not present

## 2017-12-09 DIAGNOSIS — R51 Headache: Secondary | ICD-10-CM | POA: Diagnosis not present

## 2017-12-09 DIAGNOSIS — Z818 Family history of other mental and behavioral disorders: Secondary | ICD-10-CM | POA: Diagnosis not present

## 2017-12-09 DIAGNOSIS — I6523 Occlusion and stenosis of bilateral carotid arteries: Secondary | ICD-10-CM | POA: Diagnosis not present

## 2017-12-09 DIAGNOSIS — Z8249 Family history of ischemic heart disease and other diseases of the circulatory system: Secondary | ICD-10-CM | POA: Diagnosis not present

## 2017-12-09 DIAGNOSIS — E162 Hypoglycemia, unspecified: Secondary | ICD-10-CM | POA: Diagnosis present

## 2017-12-09 DIAGNOSIS — Z800 Family history of malignant neoplasm of digestive organs: Secondary | ICD-10-CM | POA: Diagnosis not present

## 2017-12-09 DIAGNOSIS — S8002XA Contusion of left knee, initial encounter: Secondary | ICD-10-CM | POA: Diagnosis present

## 2017-12-09 LAB — LIPID PANEL
CHOL/HDL RATIO: 2.4 ratio
CHOLESTEROL: 87 mg/dL (ref 0–200)
HDL: 36 mg/dL — AB (ref >40)
LDL Cholesterol: 37 mg/dL (ref 0–99)
Triglycerides: 72 mg/dL (ref <150)
VLDL: 14 mg/dL (ref 0–40)

## 2017-12-09 LAB — GLUCOSE, CAPILLARY
GLUCOSE-CAPILLARY: 89 mg/dL (ref 65–99)
GLUCOSE-CAPILLARY: 83 mg/dL (ref 65–99)
GLUCOSE-CAPILLARY: 54 mg/dL — AB (ref 65–99)
GLUCOSE-CAPILLARY: 160 mg/dL — AB (ref 65–99)
GLUCOSE-CAPILLARY: 153 mg/dL — AB (ref 65–99)
Glucose-Capillary: 117 mg/dL — ABNORMAL HIGH (ref 65–99)
Glucose-Capillary: 190 mg/dL — ABNORMAL HIGH (ref 65–99)
Glucose-Capillary: 62 mg/dL — ABNORMAL LOW (ref 65–99)
Glucose-Capillary: 63 mg/dL — ABNORMAL LOW (ref 65–99)
Glucose-Capillary: 80 mg/dL (ref 65–99)
Glucose-Capillary: 84 mg/dL (ref 65–99)

## 2017-12-09 LAB — TSH: TSH: 0.92 u[IU]/mL (ref 0.350–4.500)

## 2017-12-09 LAB — HEMOGLOBIN A1C
Hgb A1c MFr Bld: 6.4 % — ABNORMAL HIGH (ref 4.8–5.6)
MEAN PLASMA GLUCOSE: 136.98 mg/dL

## 2017-12-09 MED ORDER — STROKE: EARLY STAGES OF RECOVERY BOOK
Freq: Once | Status: AC
  Administered 2017-12-09: 03:00:00

## 2017-12-09 MED ORDER — ACETAMINOPHEN 650 MG RE SUPP: 650.0000 mg | RECTAL | Status: DC | PRN

## 2017-12-09 MED ORDER — ATORVASTATIN CALCIUM 20 MG PO TABS
40.0000 mg | ORAL_TABLET | Freq: Every evening | ORAL | Status: DC
  Administered 2017-12-09: 20:00:00 40 mg via ORAL
  Filled 2017-12-09: qty 2

## 2017-12-09 MED ORDER — ENOXAPARIN SODIUM 40 MG/0.4ML ~~LOC~~ SOLN
40.0000 mg | SUBCUTANEOUS | Status: DC
  Filled 2017-12-09: qty 0.4

## 2017-12-09 MED ORDER — ACETAMINOPHEN 325 MG PO TABS
650.0000 mg | ORAL_TABLET | ORAL | Status: DC | PRN
  Administered 2017-12-09: 20:00:00 650 mg via ORAL
  Filled 2017-12-09: qty 2

## 2017-12-09 MED ORDER — ACETAMINOPHEN 160 MG/5ML PO SOLN: 650.0000 mg | ORAL | Status: DC | PRN

## 2017-12-09 MED ORDER — GUAIFENESIN-DM 100-10 MG/5ML PO SYRP
5.0000 mL | ORAL_SOLUTION | ORAL | Status: DC | PRN
  Administered 2017-12-09 (×2): 5 mL via ORAL
  Filled 2017-12-09 (×3): qty 5

## 2017-12-09 MED ORDER — DEXTROSE 5 % IV SOLN
INTRAVENOUS | Status: DC
  Administered 2017-12-09: 13:00:00 via INTRAVENOUS

## 2017-12-09 MED ORDER — ONDANSETRON HCL 4 MG/2ML IJ SOLN
4.0000 mg | Freq: Four times a day (QID) | INTRAMUSCULAR | Status: DC | PRN
Start: 2017-12-09 — End: 2017-12-10
  Administered 2017-12-09 – 2017-12-10 (×2): 4 mg via INTRAVENOUS
  Filled 2017-12-09 (×2): qty 2

## 2017-12-09 NOTE — Progress Notes (Signed)
*  PRELIMINARY RESULTS* Echocardiogram 2D Echocardiogram has been performed.  Cristela BlueHege, Kemaya Dorner 12/09/2017, 12:16 PM

## 2017-12-09 NOTE — Evaluation (Signed)
Physical Therapy Evaluation Patient Details Name: Joan DaltonLinda A Brady MRN: [id number] DOB: [birth year] Today's Date: 12/09/2017   History of Present Illness  Pt is a 77 y/o female y.o. female who presents with 2 episodes of right sided hemiparesis.  Patient states that 2 days ago she woke up and was unable to use her right leg or right arm.  She states the symptoms resolved throughout the day.  She did not seek any medical attention at that time.  Yesterday she was "normal" per her report.  This morning when she woke up she again had report of this right-sided hemiparesis, with some hemisensory deficit as well.  She did not seek immediate medical attention, but as the symptoms persisted through the majority of the day she did come to the ED this evening.  At this time her symptoms have resolved again.  Per neurology: MRI of the brain reviewed and shows no acute changes.  Carotid dopplers show no evidence of hemodynamically significant stenosis.  Although TIA in the differential, can not rule out right sided weakness being secondary to a Todd's or there being a cervical etiology.  Further work up recommended.      Clinical Impression  Pt Ind with all functional tasks including sup to/from sit, transfers, and amb both with and without a RW.  Pt appears at baseline at this time with no skilled PT needs.  Pt does continue to have some concerns of R-sided weakness returning in the future and would feel more comfortable having a RW if possible for peace of mind and to minimize risk of future falls if R-sided weakness does return.  Will discontinue PT orders at this time but will reassess pt at a future date pending a change in status upon receipt of new PT orders.      Follow Up Recommendations No PT follow up    Equipment Recommendations  Rolling walker with 5" wheels;Other (comment)(Pt would like a RW if possible secondary to concerns of R-sided weakness returning)    Recommendations for Other Services        Precautions / Restrictions Precautions Precautions: Fall Restrictions Weight Bearing Restrictions: No      Mobility  Bed Mobility Overal bed mobility: Independent                Transfers Overall transfer level: Independent               General transfer comment: Good eccentric and concentric control during transfers with good stability upon standing  Ambulation/Gait Ambulation/Gait assistance: Independent Ambulation Distance (Feet): 150 Feet Assistive device: None;Rolling walker (2 wheeled) Gait Pattern/deviations: WFL(Within Functional Limits)   Gait velocity interpretation: at or above normal speed for age/gender General Gait Details: Pt ambulated with very good stability with a RW primarily during assessment but was steady without AD as well  Stairs            Wheelchair Mobility    Modified Rankin (Stroke Patients Only)       Balance Overall balance assessment: Independent                                           Pertinent Vitals/Pain Pain Assessment: 0-10 Pain Score: 1  Pain Location: B knees from recent falls Pain Descriptors / Indicators: Sore Pain Intervention(s): Monitored during session    Home Living Family/patient expects to be discharged to:: Private residence  Living Arrangements: Alone Available Help at Discharge: Family;Available PRN/intermittently Type of Home: House Home Access: Level entry     Home Layout: One level Home Equipment: None      Prior Function Level of Independence: Independent         Comments: Pt Ind with amb community distances without AD, Ind with all ADLs; Pt reports two recent falls secondary to this onset of R-sided weakness but has no fall history prior to this current onset of weakness     Hand Dominance   Dominant Hand: Left    Extremity/Trunk Assessment   Upper Extremity Assessment Upper Extremity Assessment: Overall WFL for tasks assessed    Lower  Extremity Assessment Lower Extremity Assessment: Overall WFL for tasks assessed       Communication   Communication: No difficulties  Cognition Arousal/Alertness: Awake/alert Behavior During Therapy: WFL for tasks assessed/performed Overall Cognitive Status: Within Functional Limits for tasks assessed                                        General Comments      Exercises Total Joint Exercises Ankle Circles/Pumps: AROM;Both;10 reps Quad Sets: Strengthening;Both;10 reps Gluteal Sets: Strengthening;Both;10 reps Straight Leg Raises: AROM;Both;10 reps Long Arc Quad: Strengthening;Both;10 reps Knee Flexion: Strengthening;Both;10 reps Marching in Standing: AROM;Both;10 reps Other Exercises Other Exercises: HEP education provided to minimize deconditioning while in acute care   Assessment/Plan    PT Assessment Patent does not need any further PT services  PT Problem List         PT Treatment Interventions      PT Goals (Current goals can be found in the Care Plan section)  Acute Rehab PT Goals PT Goal Formulation: All assessment and education complete, DC therapy    Frequency     Barriers to discharge        Co-evaluation               AM-PAC PT "6 Clicks" Daily Activity  Outcome Measure Difficulty turning over in bed (including adjusting bedclothes, sheets and blankets)?: None Difficulty moving from lying on back to sitting on the side of the bed? : None Difficulty sitting down on and standing up from a chair with arms (e.g., wheelchair, bedside commode, etc,.)?: None Help needed moving to and from a bed to chair (including a wheelchair)?: None Help needed walking in hospital room?: None Help needed climbing 3-5 steps with a railing? : None 6 Click Score: 24    End of Session Equipment Utilized During Treatment: Gait belt Activity Tolerance: Patient tolerated treatment well Patient left: in bed;with bed alarm set;with call bell/phone within  reach;with family/visitor present Nurse Communication: Mobility status PT Visit Diagnosis: Muscle weakness (generalized) (M62.81)    Time: 1610-9604 PT Time Calculation (min) (ACUTE ONLY): 30 min   Charges:   PT Evaluation $PT Eval Low Complexity: 1 Low PT Treatments $Therapeutic Exercise: 8-22 mins   PT G Codes:        DElly Modena PT, DPT 12/09/17, 3:19 PM

## 2017-12-09 NOTE — ED Notes (Signed)
Patient transported to 115

## 2017-12-09 NOTE — Progress Notes (Signed)
OT Cancellation Note  Patient Details Name: Joan DaltonLinda A Brady MRN: [id number] DOB: Mar 26, 1941   Cancelled Treatment:    Reason Eval/Treat Not Completed: Patient at procedure or test/ unavailable. Order received, chart reviewed. Upon attempt this afternoon, pt out of the room for diagnostic testing. Will re-attempt OT evaluation next date as appropriate.   Richrd PrimeJamie Stiller, MPH, MS, OTR/L ascom [phone] 12/09/17, 3:14 PM

## 2017-12-09 NOTE — Progress Notes (Signed)
SLP Cancellation Note  Patient Details Name: Joan DaltonLinda A Crandall MRN: [id number] DOB: 1941/02/06   Cancelled treatment:       Reason Eval/Treat Not Completed: SLP screened, no needs identified, will sign off. Reviewed chart and spoke with pt and nsg. Pt is a 77 y/o female y.o. female who presents with 2 episodes of right sided hemiparesis.  Patient states that 2 days ago she woke up and was unable to use her right leg or right arm.  She states the symptoms resolved throughout the day.  She did not seek any medical attention at that time.  Yesterday she was "normal" per her report.  This morning when she woke up she again had report of this right-sided hemiparesis, with some hemisensory deficit as well.  She did not seek immediate medical attention, but as the symptoms persisted through the majority of the day she did come to the ED this evening.  At this time her symptoms have resolved again. Pt reports that she is back to baseline. Nsg and pt report that she has not had any difficulty swallowing. Speech is clear in conversation. No ST indicated at this time. Will sign-off. Please re-consult if further concerns arise.      [place] 12/09/2017, 2:50 PM

## 2017-12-09 NOTE — Progress Notes (Signed)
Mel AlmondJada, NT notified this RN of FSBG of 54. Patient asymptomatic. 4oz of orange juice given and patients lunch tray given. MD notified and D5 started again. Recheck FSBG is 62. Patient continues to eat lunch tray, again asymptomatic. Recheck FSBG reads 160. Will continue to monitor. Otilio JeffersonMadelyn S Fenton, RN

## 2017-12-09 NOTE — Consult Note (Signed)
Referring Physician: Elisabeth PigeonVachhani    Chief Complaint: Right sided weakness  HPI: Joan DaltonLinda A Othman is an 77 y.o. female who reports episodes of right sided weakness.  Patient reports that her first episode was on 3/19.  She reports onset in the morning and lasting throughout the day.  Resolved spontaneously in the evening.  Patient reports no involvement of face or speech.  Patient did well until yesterday when she again awakened with right sided weakness.  Noted numbness at that time as well.  Again no facial or speech involvement.  Was severe enough that she was unable to walk and was scooting herself along the floor.  Her daughter visited and saw patient was unable to walk and patient was brought in for evaluation at that time.    Date last known well: Date: 12/07/2017 Time last known well: Time: 23:00 tPA Given: No: Outside time window, resolution of symptoms  Past Medical History:  Diagnosis Date  . Diabetes mellitus   . Hypercholesteremia   . Hypertension     Past Surgical History:  Procedure Laterality Date  . EUS  05/04/2012   Procedure: UPPER ENDOSCOPIC ULTRASOUND (EUS) LINEAR;  Surgeon: Rachael Feeaniel P Jacobs, MD;  Location: [facility];  Service: Endoscopy;  Laterality: N/A;  radial linear   . TUBAL LIGATION      Family History  Problem Relation Age of Onset  . Depression Mother   . Diabetes Mother   . Hypertension Father   . Heart attack Father    Social History:  reports that she has been smoking.  She has been smoking about 0.50 packs per day. She has never used smokeless tobacco. She reports that she does not drink alcohol or use drugs.  Allergies:  Allergies  Allergen Reactions  . Metformin Nausea Only  . Sitagliptin Nausea Only    Medications:  I have reviewed the patient's current medications. Prior to Admission:  Medications Prior to Admission  Medication Sig Dispense Refill Last Dose  . atorvastatin (LIPITOR) 40 MG tablet Take 40 mg by mouth every evening.    12/07/2017 at Unknown time  . GLIPIZIDE XL 10 MG 24 hr tablet Take 10 mg by mouth 2 (two) times daily.    12/07/2017 at Unknown time  . lisinopril (PRINIVIL,ZESTRIL) 10 MG tablet TAKE ONE TABLET BY MOUTH ONCE DAILY 30 tablet 0 12/07/2017 at Unknown time  . pioglitazone (ACTOS) 30 MG tablet Take 30 mg by mouth daily.   12/07/2017 at Unknown time   Scheduled: . atorvastatin  40 mg Oral QPM  . enoxaparin (LOVENOX) injection  40 mg Subcutaneous Q24H    ROS: History obtained from the patient  General ROS: negative for - chills, fatigue, fever, night sweats, weight gain or weight loss Psychological ROS: negative for - behavioral disorder, hallucinations, memory difficulties, mood swings or suicidal ideation Ophthalmic ROS: negative for - blurry vision, double vision, eye pain or loss of vision ENT ROS: negative for - epistaxis, nasal discharge, oral lesions, sore throat, tinnitus or vertigo Allergy and Immunology ROS: negative for - hives or itchy/watery eyes Hematological and Lymphatic ROS: negative for - bleeding problems, bruising or swollen lymph nodes Endocrine ROS: negative for - galactorrhea, hair pattern changes, polydipsia/polyuria or temperature intolerance Respiratory ROS: negative for - cough, hemoptysis, shortness of breath or wheezing Cardiovascular ROS: negative for - chest pain, dyspnea on exertion, edema or irregular heartbeat Gastrointestinal ROS: negative for - abdominal pain, diarrhea, hematemesis, nausea/vomiting or stool incontinence Genito-Urinary ROS: negative for - dysuria, hematuria, incontinence  or urinary frequency/urgency Musculoskeletal ROS: negative for - joint swelling or muscular weakness Neurological ROS: as noted in HPI Dermatological ROS: negative for rash and skin lesion changes  Physical Examination: Blood pressure (!) 134/49, pulse 71, temperature 98.8 F (37.1 C), temperature source Oral, resp. rate 16, height 5\' 4"  (1.626 m), weight 69.5 kg (153 lb 4.8  oz), SpO2 96 %.  HEENT-  Normocephalic, no lesions, without obvious abnormality.  Normal external eye and conjunctiva.  Normal TM's bilaterally.  Normal auditory canals and external ears. Normal external nose, mucus membranes and septum.  Normal pharynx. Cardiovascular- S1, S2 normal, pulses palpable throughout   Lungs- chest clear, no wheezing, rales, normal symmetric air entry Abdomen- soft, non-tender; bowel sounds normal; no masses,  no organomegaly Extremities- no edema Lymph-no adenopathy palpable Musculoskeletal-no joint tenderness, deformity or swelling Skin-bruising on knees bilaterally  Neurological Examination   Mental Status: Alert, oriented, thought content appropriate.  Speech fluent without evidence of aphasia.  Able to follow 3 step commands without difficulty. Cranial Nerves: II: Discs flat bilaterally; Visual fields grossly normal, pupils equal, round, reactive to light and accommodation III,IV, VI: ptosis not present, extra-ocular motions intact bilaterally V,VII: decrease in right NLF, facial light touch sensation normal bilaterally VIII: hearing normal bilaterally IX,X: gag reflex present XI: bilateral shoulder shrug XII: midline tongue extension Motor: Right : Upper extremity   5/5    Left:     Upper extremity   5/5  Lower extremity   5/5     Lower extremity   5/5 Tone and bulk:normal tone throughout; no atrophy noted Sensory: Pinprick and light touch intact throughout, bilaterally Deep Tendon Reflexes: 2+ in the upper extremities and absent in the lower extremities Plantars: Right: downgoing   Left: downgoing Cerebellar: Normal finger-to-nose, normal rapid alternating movements and normal heel-to-shin testing bilaterally Gait: not tested due to safety concerns    Laboratory Studies:  Basic Metabolic Panel: Recent Labs  Lab 12/08/17 02-11-11  NA 136  K 3.8  CL 101  CO2 24  GLUCOSE 75  BUN 16  CREATININE 0.78  CALCIUM 9.0    Liver Function  Tests: Recent Labs  Lab 12/08/17 2111/02/11  AST 57*  ALT 26  ALKPHOS 39  BILITOT 0.6  PROT 7.8  ALBUMIN 3.5   No results for input(s): LIPASE, AMYLASE in the last 168 hours. No results for input(s): AMMONIA in the last 168 hours.  CBC: Recent Labs  Lab 12/08/17 02/11/11  WBC 8.6  NEUTROABS 6.6*  HGB 13.1  HCT 39.4  MCV 91.6  PLT 233    Cardiac Enzymes: Recent Labs  Lab 12/08/17 2111-02-11  TROPONINI 0.04*    BNP: Invalid input(s): POCBNP  CBG: Recent Labs  Lab 12/09/17 0402 12/09/17 0550 12/09/17 0616 12/09/17 0752 12/09/17 1236  GLUCAP 83 63* 80 84 54*    Microbiology: No results found for this or any previous visit.  Coagulation Studies: Recent Labs    12/08/17 02-11-2111  LABPROT 12.2  INR 0.91    Urinalysis: No results for input(s): COLORURINE, LABSPEC, PHURINE, GLUCOSEU, HGBUR, BILIRUBINUR, KETONESUR, PROTEINUR, UROBILINOGEN, NITRITE, LEUKOCYTESUR in the last 168 hours.  Invalid input(s): APPERANCEUR  Lipid Panel:    Component Value Date/Time   CHOL 87 12/09/2017 0551   TRIG 72 12/09/2017 0551   HDL 36 (L) 12/09/2017 0551   CHOLHDL 2.4 12/09/2017 0551   VLDL 14 12/09/2017 0551   LDLCALC 37 12/09/2017 0551    HgbA1C:  Lab Results  Component Value Date   HGBA1C  6.4 (H) 12/09/2017    Urine Drug Screen:  No results found for: LABOPIA, COCAINSCRNUR, LABBENZ, AMPHETMU, THCU, LABBARB  Alcohol Level:  Recent Labs  Lab 12/08/17 2112  ETH <10   Imaging: Ct Head Wo Contrast  Result Date: 12/08/2017 CLINICAL DATA:  RIGHT-sided weakness for 2 days.  Denies trauma. EXAM: CT HEAD WITHOUT CONTRAST TECHNIQUE: Contiguous axial images were obtained from the base of the skull through the vertex without intravenous contrast. COMPARISON:  CT head 12/21/2005. FINDINGS: Brain: No evidence of acute infarction, hemorrhage, hydrocephalus, extra-axial collection or mass lesion/mass effect. Mild atrophy. Hypoattenuation of white matter, likely small vessel disease.  Vascular: Calcification of the cavernous internal carotid arteries consistent with cerebrovascular atherosclerotic disease. No signs of intracranial large vessel occlusion. Skull: Calvarium intact. Sinuses/Orbits: No acute finding. Other: Empty sella with expansion, uncertain significance. Compared with priors, similar appearance. IMPRESSION: No intracranial abnormality can be identified as a cause of acute RIGHT-sided weakness. Cerebrovascular atherosclerotic disease without signs of large vessel occlusion. Empty sella with expansion, uncertain significance. Electronically Signed   By: Elsie Stain M.D.   On: 12/08/2017 21:41   Mr Brain Wo Contrast  Result Date: 12/09/2017 CLINICAL DATA:  Acute presentation with weakness and hypoglycemia. Found on the floor with confusion. Recent headache. EXAM: MRI HEAD WITHOUT CONTRAST MRA HEAD WITHOUT CONTRAST TECHNIQUE: Multiplanar, multiecho pulse sequences of the brain and surrounding structures were obtained without intravenous contrast. Angiographic images of the head were obtained using MRA technique without contrast. COMPARISON:  CT 12/08/2017 FINDINGS: MRI HEAD FINDINGS Brain: Diffusion imaging does not show any acute or subacute infarction. Brainstem is normal. Tiny old small vessel cerebellar infarction on the left. Cerebral hemispheres are normal for age with mild age related volume loss and a few punctate foci of T2 and FLAIR signal within the white matter. No cortical or large vessel territory infarction. No mass lesion, hemorrhage, hydrocephalus or extra-axial collection. Vascular: Major vessels at the base of the brain show flow. Skull and upper cervical spine: Negative Sinuses/Orbits: Sinuses are clear. Mastoid effusion on the right. Orbits are normal. Other: Thornwaldt cyst of the nasopharynx, not significant. MRA HEAD FINDINGS Both internal carotid arteries are widely patent through the skull base and siphon regions. The anterior and middle cerebral  vessels are normal without proximal stenosis, aneurysm or vascular malformation. The right vertebral artery is a large vessel widely patent to the basilar. Right PICA shows flow. No antegrade flow seen in the left vertebral artery at the foramen magnum level. Flow signal in the more distal left vertebral artery is probably retrograde to PICA. No basilar stenosis. Superior cerebellar and posterior cerebral arteries are patent. More distal branch vessels show some atherosclerotic irregularity. IMPRESSION: No acute intracranial finding. Essentially normal parenchymal examination for age. Mild age related volume loss without a pattern of widespread small-vessel disease. The left vertebral artery does not show antegrade flow at the foramen magnum. There is retrograde flow to PICA. 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 Electronically Signed   By: Paulina Fusi M.D.   On: 12/09/2017 11:31   US Carotid Bilateral (at [facility] And [facility] Only)  Result Date: 12/09/2017 CLINICAL DATA:  Right-sided weakness, hypertension, diabetes EXAM: BILATERAL CAROTID DUPLEX ULTRASOUND TECHNIQUE: Wallace Cullens scale imaging, color Doppler and duplex ultrasound were performed of bilateral carotid and vertebral arteries in the neck. COMPARISON:  12/08/2017 FINDINGS: Criteria: Quantification of carotid stenosis is based on velocity parameters that correlate the residual internal carotid diameter with NASCET-based stenosis levels, using  the diameter of the distal internal carotid lumen as the denominator for stenosis measurement. The following velocity measurements were obtained: RIGHT ICA:  108/28 cm/sec CCA:  105/15 cm/sec SYSTOLIC ICA/CCA RATIO:  1.0 DIASTOLIC ICA/CCA RATIO:  1.7 ECA:  139 cm/sec LEFT ICA:  88/16 cm/sec CCA:  113/21 cm/sec SYSTOLIC ICA/CCA RATIO:  0.8 DIASTOLIC ICA/CCA RATIO:  0.8 ECA:  140 cm/sec RIGHT CAROTID ARTERY: Minor echogenic shadowing plaque formation. No hemodynamically significant right ICA stenosis,  velocity elevation, or turbulent flow. Degree of narrowing less than 50%. RIGHT VERTEBRAL ARTERY:  Antegrade LEFT CAROTID ARTERY: Similar scattered minor echogenic plaque formation. No hemodynamically significant left ICA stenosis, velocity elevation, or turbulent flow. LEFT VERTEBRAL ARTERY:  Antegrade IMPRESSION: Minor carotid atherosclerosis. No hemodynamically significant ICA stenosis. Degree of narrowing less than 50% bilaterally by ultrasound criteria. Patent antegrade vertebral flow bilaterally Electronically Signed   By: Judie Petit.  Shick M.D.   On: 12/09/2017 10:50   Mr Maxine Glenn Head/brain ZO Cm  Result Date: 12/09/2017 CLINICAL DATA:  Acute presentation with weakness and hypoglycemia. Found on the floor with confusion. Recent headache. EXAM: MRI HEAD WITHOUT CONTRAST MRA HEAD WITHOUT CONTRAST TECHNIQUE: Multiplanar, multiecho pulse sequences of the brain and surrounding structures were obtained without intravenous contrast. Angiographic images of the head were obtained using MRA technique without contrast. COMPARISON:  CT 12/08/2017 FINDINGS: MRI HEAD FINDINGS Brain: Diffusion imaging does not show any acute or subacute infarction. Brainstem is normal. Tiny old small vessel cerebellar infarction on the left. Cerebral hemispheres are normal for age with mild age related volume loss and a few punctate foci of T2 and FLAIR signal within the white matter. No cortical or large vessel territory infarction. No mass lesion, hemorrhage, hydrocephalus or extra-axial collection. Vascular: Major vessels at the base of the brain show flow. Skull and upper cervical spine: Negative Sinuses/Orbits: Sinuses are clear. Mastoid effusion on the right. Orbits are normal. Other: Thornwaldt cyst of the nasopharynx, not significant. MRA HEAD FINDINGS Both internal carotid arteries are widely patent through the skull base and siphon regions. The anterior and middle cerebral vessels are normal without proximal stenosis, aneurysm or vascular  malformation. The right vertebral artery is a large vessel widely patent to the basilar. Right PICA shows flow. No antegrade flow seen in the left vertebral artery at the foramen magnum level. Flow signal in the more distal left vertebral artery is probably retrograde to PICA. No basilar stenosis. Superior cerebellar and posterior cerebral arteries are patent. More distal branch vessels show some atherosclerotic irregularity. IMPRESSION: No acute intracranial finding. Essentially normal parenchymal examination for age. Mild age related volume loss without a pattern of widespread small-vessel disease. The left vertebral artery does not show antegrade flow at the foramen magnum. There is retrograde flow to PICA. 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 Electronically Signed   By: Paulina Fusi M.D.   On: 12/09/2017 11:31    Assessment: 77 y.o. female with multiple vascular risk factors who presents with episodes of right hemiparesis.  Patient feels that she is currently at baseline.  MRI of the brain reviewed and shows no acute changes.  Carotid dopplers show no evidence of hemodynamically significant stenosis.  Echocardiogram pending.  A1c 6.4, LDL 37.  Although TIA in the differential, can not rule out right sided weakness being secondary to a Todd's or there being a cervical etiology.  Further work up recommended.    Stroke Risk Factors - diabetes mellitus, hyperlipidemia, hypertension and smoking  Plan: 1.  MRI of the cervical spine without contrast 2. PT consult, OT consult 3. Prophylactic therapy-Antiplatelet med: Aspirin - dose 325mg  daily 4. Telemetry monitoring 5. Frequent neuro checks 6. EEG    Thana Farr, MD Neurology [phone] 12/09/2017, 12:58 PM

## 2017-12-09 NOTE — ED Notes (Signed)
Dr. Anne HahnWillis aware of improved blood sugar. Second half of d50 not needed. Pt prepared for transport to admission room.

## 2017-12-09 NOTE — Progress Notes (Signed)
[employer] [facility] - [place] [facility]   PATIENT NAME: Joan PallLinda Matheson    MR#:  [id number]  DATE OF BIRTH:  1941/06/04  SUBJECTIVE:  CHIEF COMPLAINT:   Chief Complaint  Patient presents with  . Weakness  . Hypoglycemia  . Numbness   Came with repeated right sided weakness, symptoms resolved. Noted to be hypoglycemic and requiring IV drip.  REVIEW OF SYSTEMS:  CONSTITUTIONAL: No fever,have fatigue or weakness.  EYES: No blurred or double vision.  EARS, NOSE, AND THROAT: No tinnitus or ear pain.  RESPIRATORY: No cough, shortness of breath, wheezing or hemoptysis.  CARDIOVASCULAR: No chest pain, orthopnea, edema.  GASTROINTESTINAL: No nausea, vomiting, diarrhea or abdominal pain.  GENITOURINARY: No dysuria, hematuria.  ENDOCRINE: No polyuria, nocturia,  HEMATOLOGY: No anemia, easy bruising or bleeding SKIN: No rash or lesion. MUSCULOSKELETAL: No joint pain or arthritis.   NEUROLOGIC: No tingling, numbness, weakness.  PSYCHIATRY: No anxiety or depression.   ROS  DRUG ALLERGIES:   Allergies  Allergen Reactions  . Metformin Nausea Only  . Sitagliptin Nausea Only    VITALS:  Blood pressure (!) 163/72, pulse 77, temperature 98.4 F (36.9 C), temperature source Oral, resp. rate (!) 22, height 5\' 4"  (1.626 m), weight 69.5 kg (153 lb 4.8 oz), SpO2 99 %.  PHYSICAL EXAMINATION:  GENERAL:  77 y.o.-year-old patient lying in the bed with no acute distress.  EYES: Pupils equal, round, reactive to light and accommodation. No scleral icterus. Extraocular muscles intact.  HEENT: Head atraumatic, normocephalic. Oropharynx and nasopharynx clear.  NECK:  Supple, no jugular venous distention. No thyroid enlargement, no tenderness.  LUNGS: Normal breath sounds bilaterally, no wheezing, rales,rhonchi or crepitation. No use of accessory muscles of respiration.  CARDIOVASCULAR: S1, S2 normal. No murmurs, rubs, or gallops.  ABDOMEN: Soft, nontender, nondistended. Bowel sounds  present. No organomegaly or mass.  EXTREMITIES: No pedal edema, cyanosis, or clubbing.  NEUROLOGIC: Cranial nerves II through XII are intact. Muscle strength 5/5 in all extremities. Sensation intact. Gait not checked.  PSYCHIATRIC: The patient is alert and oriented x 3.  SKIN: No obvious rash, lesion, or ulcer.   Physical Exam LABORATORY PANEL:   CBC Recent Labs  Lab 12/08/17 2112  WBC 8.6  HGB 13.1  HCT 39.4  PLT 233   ------------------------------------------------------------------------------------------------------------------  Chemistries  Recent Labs  Lab 12/08/17 2112  NA 136  K 3.8  CL 101  CO2 24  GLUCOSE 75  BUN 16  CREATININE 0.78  CALCIUM 9.0  AST 57*  ALT 26  ALKPHOS 39  BILITOT 0.6   ------------------------------------------------------------------------------------------------------------------  Cardiac Enzymes Recent Labs  Lab 12/08/17 2112  TROPONINI 0.04*   ------------------------------------------------------------------------------------------------------------------  RADIOLOGY:  Ct Head Wo Contrast  Result Date: 12/08/2017 CLINICAL DATA:  RIGHT-sided weakness for 2 days.  Denies trauma. EXAM: CT HEAD WITHOUT CONTRAST TECHNIQUE: Contiguous axial images were obtained from the base of the skull through the vertex without intravenous contrast. COMPARISON:  CT head 12/21/2005. FINDINGS: Brain: No evidence of acute infarction, hemorrhage, hydrocephalus, extra-axial collection or mass lesion/mass effect. Mild atrophy. Hypoattenuation of white matter, likely small vessel disease. Vascular: Calcification of the cavernous internal carotid arteries consistent with cerebrovascular atherosclerotic disease. No signs of intracranial large vessel occlusion. Skull: Calvarium intact. Sinuses/Orbits: No acute finding. Other: Empty sella with expansion, uncertain significance. Compared with priors, similar appearance. IMPRESSION: No intracranial abnormality can  be identified as a cause of acute RIGHT-sided weakness. Cerebrovascular atherosclerotic disease without signs of large vessel occlusion. Empty sella with expansion, uncertain  significance. Electronically Signed   By: Elsie Stain M.D.   On: 12/08/2017 21:41   Mr Brain Wo Contrast  Result Date: 12/09/2017 CLINICAL DATA:  Acute presentation with weakness and hypoglycemia. Found on the floor with confusion. Recent headache. EXAM: MRI HEAD WITHOUT CONTRAST MRA HEAD WITHOUT CONTRAST TECHNIQUE: Multiplanar, multiecho pulse sequences of the brain and surrounding structures were obtained without intravenous contrast. Angiographic images of the head were obtained using MRA technique without contrast. COMPARISON:  CT 12/08/2017 FINDINGS: MRI HEAD FINDINGS Brain: Diffusion imaging does not show any acute or subacute infarction. Brainstem is normal. Tiny old small vessel cerebellar infarction on the left. Cerebral hemispheres are normal for age with mild age related volume loss and a few punctate foci of T2 and FLAIR signal within the white matter. No cortical or large vessel territory infarction. No mass lesion, hemorrhage, hydrocephalus or extra-axial collection. Vascular: Major vessels at the base of the brain show flow. Skull and upper cervical spine: Negative Sinuses/Orbits: Sinuses are clear. Mastoid effusion on the right. Orbits are normal. Other: Thornwaldt cyst of the nasopharynx, not significant. MRA HEAD FINDINGS Both internal carotid arteries are widely patent through the skull base and siphon regions. The anterior and middle cerebral vessels are normal without proximal stenosis, aneurysm or vascular malformation. The right vertebral artery is a large vessel widely patent to the basilar. Right PICA shows flow. No antegrade flow seen in the left vertebral artery at the foramen magnum level. Flow signal in the more distal left vertebral artery is probably retrograde to PICA. No basilar stenosis. Superior  cerebellar and posterior cerebral arteries are patent. More distal branch vessels show some atherosclerotic irregularity. IMPRESSION: No acute intracranial finding. Essentially normal parenchymal examination for age. Mild age related volume loss without a pattern of widespread small-vessel disease. The left vertebral artery does not show antegrade flow at the foramen magnum. There is retrograde flow to PICA. 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 Electronically Signed   By: Paulina Fusi M.D.   On: 12/09/2017 11:31   US Carotid Bilateral (at [facility] Only)  Result Date: 12/09/2017 CLINICAL DATA:  Right-sided weakness, hypertension, diabetes EXAM: BILATERAL CAROTID DUPLEX ULTRASOUND TECHNIQUE: Wallace Cullens scale imaging, color Doppler and duplex ultrasound were performed of bilateral carotid and vertebral arteries in the neck. COMPARISON:  12/08/2017 FINDINGS: Criteria: Quantification of carotid stenosis is based on velocity parameters that correlate the residual internal carotid diameter with NASCET-based stenosis levels, using the diameter of the distal internal carotid lumen as the denominator for stenosis measurement. The following velocity measurements were obtained: RIGHT ICA:  108/28 cm/sec CCA:  105/15 cm/sec SYSTOLIC ICA/CCA RATIO:  1.0 DIASTOLIC ICA/CCA RATIO:  1.7 ECA:  139 cm/sec LEFT ICA:  88/16 cm/sec CCA:  113/21 cm/sec SYSTOLIC ICA/CCA RATIO:  0.8 DIASTOLIC ICA/CCA RATIO:  0.8 ECA:  140 cm/sec RIGHT CAROTID ARTERY: Minor echogenic shadowing plaque formation. No hemodynamically significant right ICA stenosis, velocity elevation, or turbulent flow. Degree of narrowing less than 50%. RIGHT VERTEBRAL ARTERY:  Antegrade LEFT CAROTID ARTERY: Similar scattered minor echogenic plaque formation. No hemodynamically significant left ICA stenosis, velocity elevation, or turbulent flow. LEFT VERTEBRAL ARTERY:  Antegrade IMPRESSION: Minor carotid atherosclerosis. No hemodynamically  significant ICA stenosis. Degree of narrowing less than 50% bilaterally by ultrasound criteria. Patent antegrade vertebral flow bilaterally Electronically Signed   By: Judie Petit.  Shick M.D.   On: 12/09/2017 10:50   Mr Maxine Glenn Head/brain JO Cm  Result Date: 12/09/2017 CLINICAL DATA:  Acute presentation  with weakness and hypoglycemia. Found on the floor with confusion. Recent headache. EXAM: MRI HEAD WITHOUT CONTRAST MRA HEAD WITHOUT CONTRAST TECHNIQUE: Multiplanar, multiecho pulse sequences of the brain and surrounding structures were obtained without intravenous contrast. Angiographic images of the head were obtained using MRA technique without contrast. COMPARISON:  CT 12/08/2017 FINDINGS: MRI HEAD FINDINGS Brain: Diffusion imaging does not show any acute or subacute infarction. Brainstem is normal. Tiny old small vessel cerebellar infarction on the left. Cerebral hemispheres are normal for age with mild age related volume loss and a few punctate foci of T2 and FLAIR signal within the white matter. No cortical or large vessel territory infarction. No mass lesion, hemorrhage, hydrocephalus or extra-axial collection. Vascular: Major vessels at the base of the brain show flow. Skull and upper cervical spine: Negative Sinuses/Orbits: Sinuses are clear. Mastoid effusion on the right. Orbits are normal. Other: Thornwaldt cyst of the nasopharynx, not significant. MRA HEAD FINDINGS Both internal carotid arteries are widely patent through the skull base and siphon regions. The anterior and middle cerebral vessels are normal without proximal stenosis, aneurysm or vascular malformation. The right vertebral artery is a large vessel widely patent to the basilar. Right PICA shows flow. No antegrade flow seen in the left vertebral artery at the foramen magnum level. Flow signal in the more distal left vertebral artery is probably retrograde to PICA. No basilar stenosis. Superior cerebellar and posterior cerebral arteries are patent.  More distal branch vessels show some atherosclerotic irregularity. IMPRESSION: No acute intracranial finding. Essentially normal parenchymal examination for age. Mild age related volume loss without a pattern of widespread small-vessel disease. The left vertebral artery does not show antegrade flow at the foramen magnum. There is retrograde flow to PICA. 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 Electronically Signed   By: Paulina Fusi M.D.   On: 12/09/2017 11:31    ASSESSMENT AND PLAN:   Principal Problem:   Right sided weakness Active Problems:   HTN (hypertension)   HLD (hyperlipidemia)   Diabetes (HCC)   Hypoglycemia  * Right sided weakness -unclear etiology, though her clinical story is very consistent with possible stroke pathology.  Symptoms have resolved now   MRI brain , carotid doppler is negative. Echo done, report awaited.  Neuro consult appreciated, ordered further work ups due to focal deficit.  *  HTN (hypertension) -permissive hypertension  * hypoglycemia in  Diabetes (HCC) -the patient's blood glucose has been low as she is normally on 24-hour glipizide at home.   Cont D5 half-normal saline with q. 2-hour glucose checks for now as she did require D50 in the ED  Check TSH, UA , C peptide. *  HLD (hyperlipidemia) -home dose statin  All the records are reviewed and case discussed with Care Management/Social Workerr. Management plans discussed with the patient, family and they are in agreement.  CODE STATUS: Full.  TOTAL TIME TAKING CARE OF THIS PATIENT: 35 minutes.     POSSIBLE D/C IN 1-2 DAYS, DEPENDING ON CLINICAL CONDITION.   Altamese Dilling M.D on 12/09/2017   Between 7am to 6pm - Pager - [phone]  After 6pm go to [email or web address] - password [email or web address]  [facility]  Office  [phone]  CC: Primary care physician; Bobby Rumpf, MD  Note: This dictation was prepared with Dragon dictation along with smaller  phrase technology. Any transcriptional errors that result from this process are unintentional.

## 2017-12-09 NOTE — Care Management (Signed)
Placed in observation for sx concerning for stroke.  PT, OT SLP, neuro consults are pending.  Brain MRI pending

## 2017-12-09 NOTE — Care Management Obs Status (Signed)
MEDICARE OBSERVATION STATUS NOTIFICATION   Patient Details  Name: Joan DaltonLinda A Rios MRN: [id number] Date of Birth: [birth year]   Medicare Observation Status Notification Given:  Yes    Eber HongGreene, Javin Nong R, RN 12/09/2017, 1:37 PM

## 2017-12-09 NOTE — Progress Notes (Signed)
OT Cancellation Note  Patient Details Name: Joan DaltonLinda A Brady MRN: [id number] DOB: [birth year]   Cancelled Treatment:    Reason Eval/Treat Not Completed: Medical issues which prohibited therapy. Per OT here this am, pt with elevated troponin level .04 with consults pending. Will continue to monitor, and intervene when appropriate. Will re-attempt this pm.   Richrd PrimeJamie Stiller, MPH, MS, OTR/L ascom [phone] 12/09/17, 1:56 PM

## 2017-12-10 ENCOUNTER — Inpatient Hospital Stay: Payer: Medicare HMO

## 2017-12-10 ENCOUNTER — Encounter: Payer: Self-pay | Admitting: Internal Medicine

## 2017-12-10 DIAGNOSIS — R531 Weakness: Secondary | ICD-10-CM

## 2017-12-10 LAB — URINALYSIS, COMPLETE (UACMP) WITH MICROSCOPIC
BILIRUBIN URINE: NEGATIVE
Glucose, UA: NEGATIVE mg/dL
HGB URINE DIPSTICK: NEGATIVE
KETONES UR: NEGATIVE mg/dL
LEUKOCYTES UA: NEGATIVE
NITRITE: NEGATIVE
Protein, ur: NEGATIVE mg/dL
Specific Gravity, Urine: 1.006 (ref 1.005–1.030)
pH: 7 (ref 5.0–8.0)

## 2017-12-10 LAB — BASIC METABOLIC PANEL
Anion gap: 7 (ref 5–15)
BUN: 11 mg/dL (ref 6–20)
CALCIUM: 8.6 mg/dL — AB (ref 8.9–10.3)
CO2: 26 mmol/L (ref 22–32)
CREATININE: 0.83 mg/dL (ref 0.44–1.00)
Chloride: 103 mmol/L (ref 101–111)
GFR calc non Af Amer: >60 mL/min (ref >60)
GLUCOSE: 198 mg/dL — AB (ref 65–99)
Potassium: 3.9 mmol/L (ref 3.5–5.1)
Sodium: 136 mmol/L (ref 135–145)

## 2017-12-10 LAB — GLUCOSE, CAPILLARY
GLUCOSE-CAPILLARY: 235 mg/dL — AB (ref 65–99)
Glucose-Capillary: 223 mg/dL — ABNORMAL HIGH (ref 65–99)

## 2017-12-10 LAB — LIPASE, BLOOD: Lipase: 22 U/L (ref 11–51)

## 2017-12-10 LAB — C-PEPTIDE: C-Peptide: 3.3 ng/mL (ref 1.1–4.4)

## 2017-12-10 MED ORDER — POLYETHYLENE GLYCOL 3350 17 G PO PACK
17.0000 g | PACK | Freq: Once | ORAL | Status: AC
  Administered 2017-12-10: 15:00:00 17 g via ORAL
  Filled 2017-12-10: qty 1

## 2017-12-10 MED ORDER — GLIPIZIDE 5 MG PO TABS: 2.5000 mg | ORAL_TABLET | Freq: Two times a day (BID) | ORAL | 0 refills | Status: AC

## 2017-12-10 MED ORDER — BISACODYL 10 MG RE SUPP
10.0000 mg | Freq: Once | RECTAL | Status: AC
  Administered 2017-12-10: 15:00:00 10 mg via RECTAL
  Filled 2017-12-10: qty 1

## 2017-12-10 MED ORDER — ASPIRIN EC 81 MG PO TBEC: 81.0000 mg | DELAYED_RELEASE_TABLET | Freq: Every day | ORAL | 0 refills | Status: AC

## 2017-12-10 NOTE — Progress Notes (Signed)
Pt is being discharged today, IV x1 was removed and all belongings were packed and returned to her. Discharge instructions were given to her and her daughter both verified understanding. 1 paper prescription was given to her. She was rolled out in a wheelchair by staff.

## 2017-12-10 NOTE — Consult Note (Signed)
No further reported weakness on R side.  Still complaint of N/V  Past Medical History:  Diagnosis Date  . Diabetes mellitus   . Hypercholesteremia   . Hypertension     Past Surgical History:  Procedure Laterality Date  . EUS  05/04/2012   Procedure: UPPER ENDOSCOPIC ULTRASOUND (EUS) LINEAR;  Surgeon: Rachael Fee, MD;  Location: [facility];  Service: Endoscopy;  Laterality: N/A;  radial linear   . TUBAL LIGATION      Family History  Problem Relation Age of Onset  . Depression Mother   . Diabetes Mother   . Hypertension Father   . Heart attack Father    Social History:  reports that she has been smoking.  She has been smoking about 0.50 packs per day. She has never used smokeless tobacco. She reports that she does not drink alcohol or use drugs.  Allergies:  Allergies  Allergen Reactions  . Metformin Nausea Only  . Sitagliptin Nausea Only    Medications:  I have reviewed the patient's current medications. Prior to Admission:  Medications Prior to Admission  Medication Sig Dispense Refill Last Dose  . atorvastatin (LIPITOR) 40 MG tablet Take 40 mg by mouth every evening.   12/07/2017 at Unknown time  . GLIPIZIDE XL 10 MG 24 hr tablet Take 10 mg by mouth 2 (two) times daily.    12/07/2017 at Unknown time  . lisinopril (PRINIVIL,ZESTRIL) 10 MG tablet TAKE ONE TABLET BY MOUTH ONCE DAILY 30 tablet 0 12/07/2017 at Unknown time  . pioglitazone (ACTOS) 30 MG tablet Take 30 mg by mouth daily.   12/07/2017 at Unknown time   Scheduled: . atorvastatin  40 mg Oral QPM  . bisacodyl  10 mg Rectal Once  . enoxaparin (LOVENOX) injection  40 mg Subcutaneous Q24H  . polyethylene glycol  17 g Oral Once    ROS: History obtained from the patient  General ROS: negative for - chills, fatigue, fever, night sweats, weight gain or weight loss Psychological ROS: negative for - behavioral disorder, hallucinations, memory difficulties, mood swings or suicidal ideation Ophthalmic ROS: negative  for - blurry vision, double vision, eye pain or loss of vision ENT ROS: negative for - epistaxis, nasal discharge, oral lesions, sore throat, tinnitus or vertigo Allergy and Immunology ROS: negative for - hives or itchy/watery eyes Hematological and Lymphatic ROS: negative for - bleeding problems, bruising or swollen lymph nodes Endocrine ROS: negative for - galactorrhea, hair pattern changes, polydipsia/polyuria or temperature intolerance Respiratory ROS: negative for - cough, hemoptysis, shortness of breath or wheezing Cardiovascular ROS: negative for - chest pain, dyspnea on exertion, edema or irregular heartbeat Gastrointestinal ROS: negative for - abdominal pain, diarrhea, hematemesis, nausea/vomiting or stool incontinence Genito-Urinary ROS: negative for - dysuria, hematuria, incontinence or urinary frequency/urgency Musculoskeletal ROS: negative for - joint swelling or muscular weakness Neurological ROS: as noted in HPI Dermatological ROS: negative for rash and skin lesion changes  Physical Examination: Blood pressure (!) 151/58, pulse 82, temperature 98.1 F (36.7 C), temperature source Oral, resp. rate 20, height 5\' 4"  (1.626 m), weight 153 lb 4.8 oz (69.5 kg), SpO2 97 %.  HEENT-  Normocephalic, no lesions, without obvious abnormality.  Normal external eye and conjunctiva.  Normal TM's bilaterally.  Normal auditory canals and external ears. Normal external nose, mucus membranes and septum.  Normal pharynx. Cardiovascular- S1, S2 normal, pulses palpable throughout   Lungs- chest clear, no wheezing, rales, normal symmetric air entry Abdomen- soft, non-tender; bowel sounds normal; no masses,  no organomegaly Extremities- no edema Lymph-no adenopathy palpable Musculoskeletal-no joint tenderness, deformity or swelling Skin-bruising on knees bilaterally  Neurological Examination   Mental Status: Alert, oriented, thought content appropriate.  Speech fluent without evidence of aphasia.   Able to follow 3 step commands without difficulty. Cranial Nerves: II: Discs flat bilaterally; Visual fields grossly normal, pupils equal, round, reactive to light and accommodation III,IV, VI: ptosis not present, extra-ocular motions intact bilaterally V,VII: decrease in right NLF, facial light touch sensation normal bilaterally VIII: hearing normal bilaterally IX,X: gag reflex present XI: bilateral shoulder shrug XII: midline tongue extension Motor: Right : Upper extremity   5/5    Left:     Upper extremity   5/5  Lower extremity   5/5     Lower extremity   5/5 Tone and bulk:normal tone throughout; no atrophy noted Sensory: Pinprick and light touch intact throughout, bilaterally Deep Tendon Reflexes: 2+ in the upper extremities and absent in the lower extremities Plantars: Right: downgoing   Left: downgoing Cerebellar: Normal finger-to-nose, normal rapid alternating movements and normal heel-to-shin testing bilaterally Gait: not tested due to safety concerns    Laboratory Studies:  Basic Metabolic Panel: Recent Labs  Lab 12/08/17 2112 12/10/17 0439  NA 136 136  K 3.8 3.9  CL 101 103  CO2 24 26  GLUCOSE 75 198*  BUN 16 11  CREATININE 0.78 0.83  CALCIUM 9.0 8.6*    Liver Function Tests: Recent Labs  Lab 12/08/17 2112  AST 57*  ALT 26  ALKPHOS 39  BILITOT 0.6  PROT 7.8  ALBUMIN 3.5   No results for input(s): LIPASE, AMYLASE in the last 168 hours. No results for input(s): AMMONIA in the last 168 hours.  CBC: Recent Labs  Lab 12/08/17 2112  WBC 8.6  NEUTROABS 6.6*  HGB 13.1  HCT 39.4  MCV 91.6  PLT 233    Cardiac Enzymes: Recent Labs  Lab 12/08/17 2112  TROPONINI 0.04*    BNP: Invalid input(s): POCBNP  CBG: Recent Labs  Lab 12/09/17 1423 12/09/17 1759 12/09/17 2128 12/10/17 0805 12/10/17 1208  GLUCAP 160* 153* 190* 223* 235*    Microbiology: No results found for this or any previous visit.  Coagulation Studies: Recent Labs     12/08/17 2112  LABPROT 12.2  INR 0.91    Urinalysis:  Recent Labs  Lab 12/09/17 0945  COLORURINE YELLOW*  LABSPEC 1.006  PHURINE 7.0  GLUCOSEU NEGATIVE  HGBUR NEGATIVE  BILIRUBINUR NEGATIVE  KETONESUR NEGATIVE  PROTEINUR NEGATIVE  NITRITE NEGATIVE  LEUKOCYTESUR NEGATIVE    Lipid Panel:    Component Value Date/Time   CHOL 87 12/09/2017 0551   TRIG 72 12/09/2017 0551   HDL 36 (L) 12/09/2017 0551   CHOLHDL 2.4 12/09/2017 0551   VLDL 14 12/09/2017 0551   LDLCALC 37 12/09/2017 0551    HgbA1C:  Lab Results  Component Value Date   HGBA1C 6.4 (H) 12/09/2017    Urine Drug Screen:  No results found for: LABOPIA, COCAINSCRNUR, LABBENZ, AMPHETMU, THCU, LABBARB  Alcohol Level:  Recent Labs  Lab 12/08/17 2112  ETH <10   Imaging: Ct Head Wo Contrast  Result Date: 12/08/2017 CLINICAL DATA:  RIGHT-sided weakness for 2 days.  Denies trauma. EXAM: CT HEAD WITHOUT CONTRAST TECHNIQUE: Contiguous axial images were obtained from the base of the skull through the vertex without intravenous contrast. COMPARISON:  CT head 12/21/2005. FINDINGS: Brain: No evidence of acute infarction, hemorrhage, hydrocephalus, extra-axial collection or mass lesion/mass effect. Mild atrophy. Hypoattenuation of  white matter, likely small vessel disease. Vascular: Calcification of the cavernous internal carotid arteries consistent with cerebrovascular atherosclerotic disease. No signs of intracranial large vessel occlusion. Skull: Calvarium intact. Sinuses/Orbits: No acute finding. Other: Empty sella with expansion, uncertain significance. Compared with priors, similar appearance. IMPRESSION: No intracranial abnormality can be identified as a cause of acute RIGHT-sided weakness. Cerebrovascular atherosclerotic disease without signs of large vessel occlusion. Empty sella with expansion, uncertain significance. Electronically Signed   By: Elsie Stain M.D.   On: 12/08/2017 21:41   Mr Brain Wo Contrast  Result  Date: 12/09/2017 CLINICAL DATA:  Acute presentation with weakness and hypoglycemia. Found on the floor with confusion. Recent headache. EXAM: MRI HEAD WITHOUT CONTRAST MRA HEAD WITHOUT CONTRAST TECHNIQUE: Multiplanar, multiecho pulse sequences of the brain and surrounding structures were obtained without intravenous contrast. Angiographic images of the head were obtained using MRA technique without contrast. COMPARISON:  CT 12/08/2017 FINDINGS: MRI HEAD FINDINGS Brain: Diffusion imaging does not show any acute or subacute infarction. Brainstem is normal. Tiny old small vessel cerebellar infarction on the left. Cerebral hemispheres are normal for age with mild age related volume loss and a few punctate foci of T2 and FLAIR signal within the white matter. No cortical or large vessel territory infarction. No mass lesion, hemorrhage, hydrocephalus or extra-axial collection. Vascular: Major vessels at the base of the brain show flow. Skull and upper cervical spine: Negative Sinuses/Orbits: Sinuses are clear. Mastoid effusion on the right. Orbits are normal. Other: Thornwaldt cyst of the nasopharynx, not significant. MRA HEAD FINDINGS Both internal carotid arteries are widely patent through the skull base and siphon regions. The anterior and middle cerebral vessels are normal without proximal stenosis, aneurysm or vascular malformation. The right vertebral artery is a large vessel widely patent to the basilar. Right PICA shows flow. No antegrade flow seen in the left vertebral artery at the foramen magnum level. Flow signal in the more distal left vertebral artery is probably retrograde to PICA. No basilar stenosis. Superior cerebellar and posterior cerebral arteries are patent. More distal branch vessels show some atherosclerotic irregularity. IMPRESSION: No acute intracranial finding. Essentially normal parenchymal examination for age. Mild age related volume loss without a pattern of widespread small-vessel disease.  The left vertebral artery does not show antegrade flow at the foramen magnum. There is retrograde flow to PICA. 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 Electronically Signed   By: Paulina Fusi M.D.   On: 12/09/2017 11:31   Mr Cervical Spine Wo Contrast  Result Date: 12/10/2017 CLINICAL DATA:  Initial evaluation for episodic right-sided hemiparesis. EXAM: MRI CERVICAL SPINE WITHOUT CONTRAST TECHNIQUE: Multiplanar, multisequence MR imaging of the cervical spine was performed. No intravenous contrast was administered. COMPARISON:  Prior brain MRI from earlier the same day. FINDINGS: Alignment: Mild straightening of the normal cervical lordosis. No listhesis or malalignment. Vertebrae: Vertebral body heights well maintained without evidence for acute or chronic fracture. Bone marrow signal intensity within normal limits. No discrete or worrisome osseous lesions. No abnormal marrow edema. Cord: Signal intensity within the cervical spinal cord is normal. Cord caliber and morphology are normal. Posterior Fossa, vertebral arteries, paraspinal tissues: Incidental note made of an empty sella. Visualized brain and posterior fossa otherwise unremarkable. Normal cyst noted at the nasopharynx. Paraspinous and prevertebral soft tissues within normal limits. Left vertebral artery is hypoplastic with poor flow void, which may be related to slow flow and/or occlusion. Normal flow void present within the dominant right vertebral artery. Disc  levels: C2-C3: Mild right-sided facet hypertrophy. No canal or foraminal stenosis. C3-C4: Shallow posterior disc protrusion mildly indents the ventral thecal sac. No significant canal or foraminal stenosis. C4-C5: Minimal annular disc bulge with uncovertebral hypertrophy. No stenosis. C5-C6: Mild diffuse disc bulge with bilateral uncovertebral hypertrophy. Mild left-sided facet hypertrophy. No significant canal stenosis. Mild left C6 foraminal narrowing. No right  foraminal encroachment. C6-C7: Mild disc bulge with uncovertebral hypertrophy without stenosis. C7-T1: Mild disc bulge. Mild bilateral facet hypertrophy. No canal or foraminal stenosis. Visualized upper thoracic spine demonstrates no significant finding. IMPRESSION: 1. No acute finding within the cervical spine. No findings to explain patient's symptoms identified. 2. Mild for age cervical spondylolysis as above without significant canal or foraminal stenosis. Electronically Signed   By: Rise Mu M.D.   On: 12/10/2017 01:24   US Carotid Bilateral (at [facility] Only)  Result Date: 12/09/2017 CLINICAL DATA:  Right-sided weakness, hypertension, diabetes EXAM: BILATERAL CAROTID DUPLEX ULTRASOUND TECHNIQUE: Wallace Cullens scale imaging, color Doppler and duplex ultrasound were performed of bilateral carotid and vertebral arteries in the neck. COMPARISON:  12/08/2017 FINDINGS: Criteria: Quantification of carotid stenosis is based on velocity parameters that correlate the residual internal carotid diameter with NASCET-based stenosis levels, using the diameter of the distal internal carotid lumen as the denominator for stenosis measurement. The following velocity measurements were obtained: RIGHT ICA:  108/28 cm/sec CCA:  105/15 cm/sec SYSTOLIC ICA/CCA RATIO:  1.0 DIASTOLIC ICA/CCA RATIO:  1.7 ECA:  139 cm/sec LEFT ICA:  88/16 cm/sec CCA:  113/21 cm/sec SYSTOLIC ICA/CCA RATIO:  0.8 DIASTOLIC ICA/CCA RATIO:  0.8 ECA:  140 cm/sec RIGHT CAROTID ARTERY: Minor echogenic shadowing plaque formation. No hemodynamically significant right ICA stenosis, velocity elevation, or turbulent flow. Degree of narrowing less than 50%. RIGHT VERTEBRAL ARTERY:  Antegrade LEFT CAROTID ARTERY: Similar scattered minor echogenic plaque formation. No hemodynamically significant left ICA stenosis, velocity elevation, or turbulent flow. LEFT VERTEBRAL ARTERY:  Antegrade IMPRESSION: Minor carotid atherosclerosis. No hemodynamically significant  ICA stenosis. Degree of narrowing less than 50% bilaterally by ultrasound criteria. Patent antegrade vertebral flow bilaterally Electronically Signed   By: Judie Petit.  Shick M.D.   On: 12/09/2017 10:50   Dg Abd 2 Views  Result Date: 12/10/2017 CLINICAL DATA:  77 year old with vomiting. EXAM: ABDOMEN - 2 VIEW COMPARISON:  CT abdomen 03/29/2012 FINDINGS: Negative for free air. Nonobstructive bowel gas pattern. Large amount of stool in the right colon. Extensive facet arthropathy in the lower lumbar spine. No large abdominal calcifications. IMPRESSION: Normal bowel gas pattern. Large amount of stool in the right colon. Electronically Signed   By: Richarda Overlie M.D.   On: 12/10/2017 14:06   Mr Maxine Glenn Head/brain VW Cm  Result Date: 12/09/2017 CLINICAL DATA:  Acute presentation with weakness and hypoglycemia. Found on the floor with confusion. Recent headache. EXAM: MRI HEAD WITHOUT CONTRAST MRA HEAD WITHOUT CONTRAST TECHNIQUE: Multiplanar, multiecho pulse sequences of the brain and surrounding structures were obtained without intravenous contrast. Angiographic images of the head were obtained using MRA technique without contrast. COMPARISON:  CT 12/08/2017 FINDINGS: MRI HEAD FINDINGS Brain: Diffusion imaging does not show any acute or subacute infarction. Brainstem is normal. Tiny old small vessel cerebellar infarction on the left. Cerebral hemispheres are normal for age with mild age related volume loss and a few punctate foci of T2 and FLAIR signal within the white matter. No cortical or large vessel territory infarction. No mass lesion, hemorrhage, hydrocephalus or extra-axial collection. Vascular: Major vessels at the base of the brain show  flow. Skull and upper cervical spine: Negative Sinuses/Orbits: Sinuses are clear. Mastoid effusion on the right. Orbits are normal. Other: Thornwaldt cyst of the nasopharynx, not significant. MRA HEAD FINDINGS Both internal carotid arteries are widely patent through the skull base and  siphon regions. The anterior and middle cerebral vessels are normal without proximal stenosis, aneurysm or vascular malformation. The right vertebral artery is a large vessel widely patent to the basilar. Right PICA shows flow. No antegrade flow seen in the left vertebral artery at the foramen magnum level. Flow signal in the more distal left vertebral artery is probably retrograde to PICA. No basilar stenosis. Superior cerebellar and posterior cerebral arteries are patent. More distal branch vessels show some atherosclerotic irregularity. IMPRESSION: No acute intracranial finding. Essentially normal parenchymal examination for age. Mild age related volume loss without a pattern of widespread small-vessel disease. The left vertebral artery does not show antegrade flow at the foramen magnum. There is retrograde flow to PICA. 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 Electronically Signed   By: Paulina Fusi M.D.   On: 12/09/2017 11:31    Assessment: 77 y.o. female with multiple vascular risk factors who presents with episodes of right hemiparesis.  Patient feels that she is currently at baseline.  MRI of the brain reviewed and shows no acute changes.  Carotid dopplers show no evidence of hemodynamically significant stenosis.  Echocardiogram pending.  A1c 6.4, LDL 37.    Back to baseline CT C spine no acute abnormalities MRI/MRA no acute abnormalities Pt stopped taking her ASA, which was restarted D/c planning

## 2017-12-10 NOTE — Evaluation (Signed)
Occupational Therapy Evaluation Patient Details Name: Joan DaltonLinda A Brady MRN: [id number] DOB: 12/28/1940 Today's Date: 12/10/2017    History of Present Illness Pt is a 77 y/o female y.o. female who presents with 2 episodes of right sided hemiparesis.  Patient states that 3 days ago she woke up and was unable to use her right leg or right arm.  She states the symptoms resolved throughout the day.  She did not seek any medical attention at that time.  Yesterday she was "normal" per her report.  Yesterday am  when she woke up she again had report of this right-sided hemiparesis, with some hemisensory deficit as well.  She did not seek immediate medical attention, but as the symptoms persisted through the majority of the day she did come to the ED this evening.  At this time her symptoms have resolved again.  Per neurology: MRI of the brain reviewed and shows no acute changes.  Carotid dopplers show no evidence of hemodynamically significant stenosis.  Although TIA in the differential, can not rule out right sided weakness being secondary to a Todd's or there being a cervical etiology.  Further work up recommended.     Clinical Impression   Pt laying in bed sleeping - and SIL sitting in chair - pt awake easy and pleasant. Following directions independently - report that her blood sugar was not good this am. But also that she did not manage her blood sugar levels well at home. Pt bilateral UE strength and AROM WFL - pt is L hand dominant but L shoulder is little weaker than R. And then dexterity if decrease from palm<> fingers in bilateral hands- could be diabetic neuropathy - but she report not having any issues with Joan Brady doing buttons, packages or tops. Upon functional mobility and standing at sink - had pain when attempting to bend R knee - did not hurt with just standing on it.  Did not had any visual issues during eval - WOuld not recommend at this time OT     [facility]     Equipment Recommendations    RW   Recommendations for Other Services       Precautions / Restrictions Precautions Precautions: Fall Restrictions Weight Bearing Restrictions: No      Mobility Bed Mobility                  Transfers                      Balance Overall balance assessment: Independent                                         ADL either performed or assessed with clinical judgement   ADL Overall ADL's : Independent                                       General ADL Comments: Sitting at EOB perform LB dressing no LOB and independent, simulated UB dressing I , attempt to stand up and simulate pulling up pants - complain of R knee pain and could not squat down      Vision Baseline Vision/History: No visual deficits;Wears glasses       Perception     Praxis  Pertinent Vitals/Pain Pain Score: 2  Pain Location: R knee Pain Descriptors / Indicators: Sore Pain Intervention(s): Limited activity within patient's tolerance     Hand Dominance Left   Extremity/Trunk Assessment Upper Extremity Assessment Upper Extremity Assessment: (Bilateral UE WFL - L shoulder 4+/5 actually weaker than R  5/5 - she is L handed - grip WFL  bilateral )           Communication Communication Communication: No difficulties   Cognition Arousal/Alertness: Awake/alert Behavior During Therapy: WFL for tasks assessed/performed Overall Cognitive Status: Within Functional Limits for tasks assessed                                     General Comments       Exercises     Shoulder Instructions      Home Living Family/patient expects to be discharged to:: Private residence Living Arrangements: Alone Available Help at Discharge: Family;Available PRN/intermittently Type of Home: House                       Home Equipment: None          Prior Functioning/Environment Level of Independence:  Independent                 OT Problem List:        OT Treatment/Interventions:      OT Goals(Current goals can be found in the care plan section) Acute Rehab OT Goals Patient Stated Goal: Pt wants to go home- after she finds out what caused the weakness Time For Goal Achievement: 12/17/17 Potential to Achieve Goals: Good  OT Frequency:     Barriers to D/C:            Co-evaluation              AM-PAC PT "6 Clicks" Daily Activity     Outcome Measure                 End of Session    Activity Tolerance:   Patient left:                     Time: 4098-1191 OT Time Calculation (min): 29 min Charges:  OT General Charges $OT Visit: 1 Visit OT Evaluation $OT Eval Low Complexity: 1 Low G-Codes: OT G-codes **NOT FOR INPATIENT CLASS** Functional Limitation: Self care  }  Oletta Cohn OTR/L,CLT 12/10/2017, 12:35 PM

## 2017-12-11 NOTE — Discharge Summary (Signed)
[facility] [place] at [facility]   PATIENT NAME: Joan Brady    MR#:  [id number]  DATE OF BIRTH:  [birth year]  DATE OF ADMISSION:  12/08/2017 ADMITTING PHYSICIAN: Oralia Manis, MD  DATE OF DISCHARGE: 12/10/2017  3:58 PM  PRIMARY CARE PHYSICIAN: Bobby Rumpf, MD   ADMISSION DIAGNOSIS:  TIA (transient ischemic attack) [G45.9] Hypoglycemia [E16.2]  DISCHARGE DIAGNOSIS:  Principal Problem:   Right sided weakness Active Problems:   HTN (hypertension)   HLD (hyperlipidemia)   Diabetes (HCC)   Hypoglycemia   SECONDARY DIAGNOSIS:   Past Medical History:  Diagnosis Date  . Diabetes mellitus   . Hypercholesteremia   . Hypertension      ADMITTING HISTORY  HISTORY OF PRESENT ILLNESS:  Joan Brady  is a 77 y.o. female who presents with 2 episodes of right sided hemiparesis.  Patient states that 2 days ago she woke up and was unable to use her right leg or right arm.  She states the symptoms resolved throughout the day.  She did not seek any medical attention at that time.  Yesterday she was "normal" per her report.  This morning when she woke up she again had report of this right-sided hemiparesis, with some hemisensory deficit as well.  She did not seek immediate medical attention, but as the symptoms persisted through the majority of the day she did come to the ED this evening.  At this time her symptoms have resolved again.  Initial workup in the ED is largely within normal limits, hospitalist were called for admission  HOSPITAL COURSE:   *TIA with right-sided weakness.  Patient was admitted for TIA on the medical floor with telemetry monitoring.  Neurochecks.  Started on aspirin and statin.  MRI of the brain was obtained and showed nothing acute.  Carotid Dopplers and echocardiogram with no significant findings.  Patient was seen by neurology Dr. Loretha Brasil who I discussed the case with.  Patient recently stopped taking aspirin which we have restarted.  Symptoms  have resolved.  *Hypoglycemia.  Patient had to be started on D5 during the hospital stay.  By the day of discharge patient blood sugars are at 220.  D5 stopped.  I have stopped her pioglitazone and decreased the dose of glipizide.  She will continue to check blood sugars twice a day and follow-up with her primary care physician.  *Constipation.  Patient had not had a bowel movement for 4 days and had 2 episodes of vomiting in the hospital without any abdominal tenderness.  Abdominal x-ray was obtained which showed no ileus or obstruction but showed constipation.  She was given MiraLAX and suppository with one bowel movement and resolution of symptoms.  Patient's other comorbidities remained stable during the hospital stay.  She is being discharged home with home health services to follow-up with her primary care physician and outpatient neurology.  CONSULTS OBTAINED:  Treatment Team:  Thana Farr, MD  DRUG ALLERGIES:   Allergies  Allergen Reactions  . Metformin Nausea Only  . Sitagliptin Nausea Only    DISCHARGE MEDICATIONS:   Allergies as of 12/10/2017      Reactions   Metformin Nausea Only   Sitagliptin Nausea Only      Medication List    STOP taking these medications   GLIPIZIDE XL 10 MG 24 hr tablet Generic drug:  glipiZIDE Replaced by:  glipiZIDE 5 MG tablet   pioglitazone 30 MG tablet Commonly known as:  ACTOS     TAKE these medications  aspirin EC 81 MG tablet Take 1 tablet (81 mg total) by mouth daily.   atorvastatin 40 MG tablet Commonly known as:  LIPITOR Take 40 mg by mouth every evening.   glipiZIDE 5 MG tablet Commonly known as:  GLUCOTROL Take 0.5 tablets (2.5 mg total) by mouth 2 (two) times daily before a meal. Replaces:  GLIPIZIDE XL 10 MG 24 hr tablet   lisinopril 10 MG tablet Commonly known as:  PRINIVIL,ZESTRIL TAKE ONE TABLET BY MOUTH ONCE DAILY       Today   VITAL SIGNS:  Blood pressure (!) 151/58, pulse 82, temperature 98.1 F  (36.7 C), temperature source Oral, resp. rate 20, height 5\' 4"  (1.626 m), weight 69.5 kg (153 lb 4.8 oz), SpO2 97 %.  I/O:  No intake or output data in the 24 hours ending 12/11/17 1546  PHYSICAL EXAMINATION:  Physical Exam  GENERAL:  77 y.o.-year-old patient lying in the bed with no acute distress.  LUNGS: Normal breath sounds bilaterally, no wheezing, rales,rhonchi or crepitation. No use of accessory muscles of respiration.  CARDIOVASCULAR: S1, S2 normal. No murmurs, rubs, or gallops.  ABDOMEN: Soft, non-tender, non-distended. Bowel sounds present. No organomegaly or mass.  NEUROLOGIC: Moves all 4 extremities. PSYCHIATRIC: The patient is alert and oriented x 3.  SKIN: No obvious rash, lesion, or ulcer.   DATA REVIEW:   CBC Recent Labs  Lab 12/08/17 2112  WBC 8.6  HGB 13.1  HCT 39.4  PLT 233    Chemistries  Recent Labs  Lab 12/08/17 2112 12/10/17 0439  NA 136 136  K 3.8 3.9  CL 101 103  CO2 24 26  GLUCOSE 75 198*  BUN 16 11  CREATININE 0.78 0.83  CALCIUM 9.0 8.6*  AST 57*  --   ALT 26  --   ALKPHOS 39  --   BILITOT 0.6  --     Cardiac Enzymes Recent Labs  Lab 12/08/17 2112  TROPONINI 0.04*    Microbiology Results  No results found for this or any previous visit.  RADIOLOGY:  Mr Cervical Spine Wo Contrast  Result Date: 12/10/2017 CLINICAL DATA:  Initial evaluation for episodic right-sided hemiparesis. EXAM: MRI CERVICAL SPINE WITHOUT CONTRAST TECHNIQUE: Multiplanar, multisequence MR imaging of the cervical spine was performed. No intravenous contrast was administered. COMPARISON:  Prior brain MRI from earlier the same day. FINDINGS: Alignment: Mild straightening of the normal cervical lordosis. No listhesis or malalignment. Vertebrae: Vertebral body heights well maintained without evidence for acute or chronic fracture. Bone marrow signal intensity within normal limits. No discrete or worrisome osseous lesions. No abnormal marrow edema. Cord: Signal  intensity within the cervical spinal cord is normal. Cord caliber and morphology are normal. Posterior Fossa, vertebral arteries, paraspinal tissues: Incidental note made of an empty sella. Visualized brain and posterior fossa otherwise unremarkable. Normal cyst noted at the nasopharynx. Paraspinous and prevertebral soft tissues within normal limits. Left vertebral artery is hypoplastic with poor flow void, which may be related to slow flow and/or occlusion. Normal flow void present within the dominant right vertebral artery. Disc levels: C2-C3: Mild right-sided facet hypertrophy. No canal or foraminal stenosis. C3-C4: Shallow posterior disc protrusion mildly indents the ventral thecal sac. No significant canal or foraminal stenosis. C4-C5: Minimal annular disc bulge with uncovertebral hypertrophy. No stenosis. C5-C6: Mild diffuse disc bulge with bilateral uncovertebral hypertrophy. Mild left-sided facet hypertrophy. No significant canal stenosis. Mild left C6 foraminal narrowing. No right foraminal encroachment. C6-C7: Mild disc bulge with uncovertebral hypertrophy without stenosis.  C7-T1: Mild disc bulge. Mild bilateral facet hypertrophy. No canal or foraminal stenosis. Visualized upper thoracic spine demonstrates no significant finding. IMPRESSION: 1. No acute finding within the cervical spine. No findings to explain patient's symptoms identified. 2. Mild for age cervical spondylolysis as above without significant canal or foraminal stenosis. Electronically Signed   By: Rise MuBenjamin  McClintock M.D.   On: 12/10/2017 01:24   Dg Abd 2 Views  Result Date: 12/10/2017 CLINICAL DATA:  77 year old with vomiting. EXAM: ABDOMEN - 2 VIEW COMPARISON:  CT abdomen 03/29/2012 FINDINGS: Negative for free air. Nonobstructive bowel gas pattern. Large amount of stool in the right colon. Extensive facet arthropathy in the lower lumbar spine. No large abdominal calcifications. IMPRESSION: Normal bowel gas pattern. Large amount of  stool in the right colon. Electronically Signed   By: Richarda OverlieAdam  Henn M.D.   On: 12/10/2017 14:06    Follow up with PCP in 1 week.  Management plans discussed with the patient, family and they are in agreement.  CODE STATUS:  Code Status History    Date Active Date Inactive Code Status Order ID Comments User Context   12/09/2017 0052 12/10/2017 1858 Full Code [id number]  Oralia ManisWillis, David, MD Inpatient      TOTAL TIME TAKING CARE OF THIS PATIENT ON DAY OF DISCHARGE: more than 30 minutes.   Molinda BailiffSrikar R Mandisa Persinger M.D on 12/11/2017 at 3:46 PM  Between 7am to 6pm - Pager - [phone]  After 6pm go to [email or web address] - password [email or web address] [facility]  [facility]  Office  [phone]  CC: Primary care physician; Bobby Rumpfarew, Khary, MD  Note: This dictation was prepared with Dragon dictation along with smaller phrase technology. Any transcriptional errors that result from this process are unintentional.

## 2017-12-12 LAB — ECHOCARDIOGRAM COMPLETE
HEIGHTINCHES: 64 in
WEIGHTICAEL: 2452.8 [oz_av]

## 2017-12-14 ENCOUNTER — Other Ambulatory Visit: Payer: Self-pay

## 2017-12-14 DIAGNOSIS — R809 Proteinuria, unspecified: Secondary | ICD-10-CM | POA: Diagnosis not present

## 2017-12-14 DIAGNOSIS — Z09 Encounter for follow-up examination after completed treatment for conditions other than malignant neoplasm: Secondary | ICD-10-CM | POA: Diagnosis not present

## 2017-12-14 DIAGNOSIS — Z79899 Other long term (current) drug therapy: Secondary | ICD-10-CM | POA: Diagnosis not present

## 2017-12-14 DIAGNOSIS — E7800 Pure hypercholesterolemia, unspecified: Secondary | ICD-10-CM | POA: Diagnosis not present

## 2017-12-14 DIAGNOSIS — I10 Essential (primary) hypertension: Secondary | ICD-10-CM | POA: Diagnosis not present

## 2017-12-14 DIAGNOSIS — E1129 Type 2 diabetes mellitus with other diabetic kidney complication: Secondary | ICD-10-CM | POA: Diagnosis not present

## 2017-12-14 NOTE — Patient Outreach (Signed)
[facility]) Care Management  12/14/2017  Ammie DaltonLinda A Simer Jan 10, 1941 [id number]   Referral Date: 12/14/17 Referral Source: [employer] Date of Admission:  12/08/17 Diagnosis: TIA Date of Discharge: 12/10/17 Facility: [facility]  Outreach attempt # 1.  Telephone call to patient for transition of care follow up.  No answer. HIPAA compliant voice message left.  Plan: RN CM will send letter and attempt patient again within 4 business days.   Bary Lericheionne J Lorenz Donley, RN, MSN [facility] Care Management Care Management Coordinator Direct Line [phone] Toll Free: [phone]  Fax: [phone]

## 2017-12-15 ENCOUNTER — Other Ambulatory Visit: Payer: Self-pay

## 2017-12-15 NOTE — Patient Outreach (Signed)
[facility]) Care Management  12/15/2017  Ammie DaltonLinda A Hazelbaker 02/06/1941 [id number]   Referral Date: 12/14/17 Referral Source: [employer] Date of Admission:  12/08/17 Diagnosis: TIA Date of Discharge: 12/10/17 Facility: [facility]  Outreach attempt # 1.  Telephone call to patient for transition of care follow up.  No answer. HIPAA compliant voice message left.  Plan: RN CM will attempt patient again within 4 business days.    Bary Lericheionne J Leia Coletti, RN, MSN [facility] Care Management Care Management Coordinator Direct Line [phone] Toll Free: [phone]  Fax: [phone]

## 2017-12-19 ENCOUNTER — Other Ambulatory Visit: Payer: Self-pay

## 2017-12-19 NOTE — Patient Outreach (Signed)
[facility]) Care Management  12/19/2017  Joan DaltonLinda A Brady 02-Jun-1941 [id number]   Referral Date:12/14/17 Referral Source:[employer] Date of Admission:12/08/17 Diagnosis:TIA Date of Discharge:12/10/17 Facility:[facility] Insurance:[employer]   3rd telephone call to patient for transition of care follow up.  No answer.  HIPAA compliant voice message left.  Plan: RN CM will wait patient return call.   If no return call will close case.    Bary Lericheionne J Josias Tomerlin, RN, MSN [facility] Care Management Care Management Coordinator Direct Line [phone] Toll Free: [phone]  Fax: [phone]

## 2017-12-28 ENCOUNTER — Other Ambulatory Visit: Payer: Self-pay

## 2017-12-28 NOTE — Patient Outreach (Signed)
[facility] Care Management  12/28/2017  Joan DaltonLinda A Brady [birth year] [id number]   Multiple attempts to establish contact with patient without success. No response from letter mailed to patient.   Plan: RN CM will close case at this time.    Bary Lericheionne J Kalden Wanke, RN, MSN [facility] Care Management Care Management Coordinator Direct Line [phone] Toll Free: [phone]  Fax: [phone]

## 2018-02-20 DIAGNOSIS — I10 Essential (primary) hypertension: Secondary | ICD-10-CM | POA: Diagnosis not present

## 2018-02-20 DIAGNOSIS — R809 Proteinuria, unspecified: Secondary | ICD-10-CM | POA: Diagnosis not present

## 2018-02-20 DIAGNOSIS — E1129 Type 2 diabetes mellitus with other diabetic kidney complication: Secondary | ICD-10-CM | POA: Diagnosis not present

## 2018-02-20 DIAGNOSIS — R600 Localized edema: Secondary | ICD-10-CM | POA: Diagnosis not present

## 2018-02-20 DIAGNOSIS — Z79899 Other long term (current) drug therapy: Secondary | ICD-10-CM | POA: Diagnosis not present

## 2018-07-26 DIAGNOSIS — E782 Mixed hyperlipidemia: Secondary | ICD-10-CM | POA: Diagnosis not present

## 2018-07-26 DIAGNOSIS — Z0000 Encounter for general adult medical examination without abnormal findings: Secondary | ICD-10-CM | POA: Diagnosis not present

## 2018-07-26 DIAGNOSIS — Z23 Encounter for immunization: Secondary | ICD-10-CM | POA: Diagnosis not present

## 2018-07-26 DIAGNOSIS — I10 Essential (primary) hypertension: Secondary | ICD-10-CM | POA: Diagnosis not present

## 2018-07-26 DIAGNOSIS — R809 Proteinuria, unspecified: Secondary | ICD-10-CM | POA: Diagnosis not present

## 2018-07-26 DIAGNOSIS — E1129 Type 2 diabetes mellitus with other diabetic kidney complication: Secondary | ICD-10-CM | POA: Diagnosis not present

## 2018-07-26 DIAGNOSIS — Z79899 Other long term (current) drug therapy: Secondary | ICD-10-CM | POA: Diagnosis not present

## 2019-03-10 IMAGING — MR MR CERVICAL SPINE W/O CM
5 series · 43 of 48 positions shown · non-contrast
Comparison: Prior brain MRI from earlier the same day.

CLINICAL DATA: Initial evaluation for episodic right-sided
hemiparesis.

EXAM:
MRI CERVICAL SPINE WITHOUT CONTRAST
TECHNIQUE: Multiplanar, multisequence MR imaging of the cervical spine was
performed. No intravenous contrast was administered.

[Series 6: T2 · sagittal · 3.0mm · 0.70mm/px · 7 of 15 slices shown (1 of 2)]
[im 1/15]
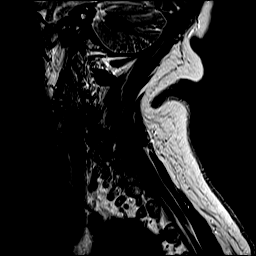
[im 3/15]
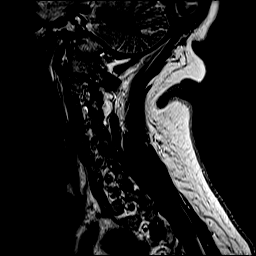
[im 5/15]
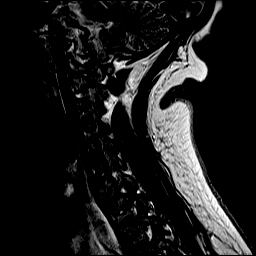
[im 8/15]
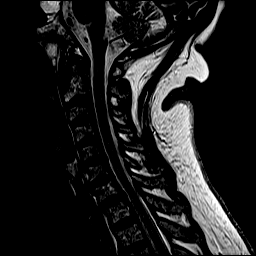
[im 10/15]
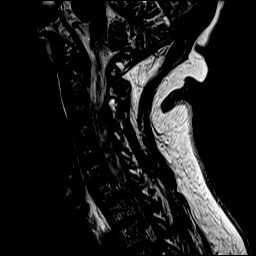
[im 12/15]
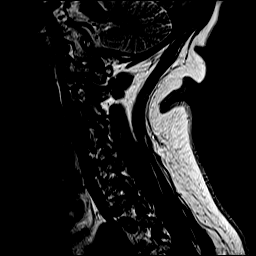
[im 15/15]
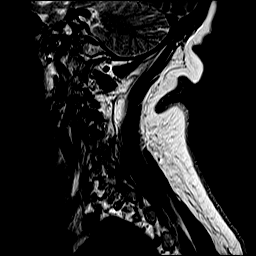

[Series 7: T1 · sagittal · 3.0mm · 0.70mm/px · 7 of 15 slices shown]
[im 1/15]
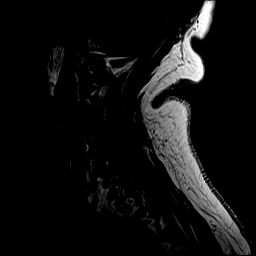
[im 3/15]
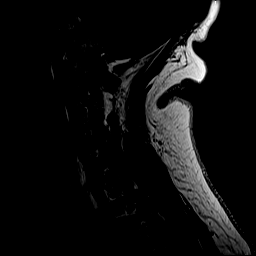
[im 5/15]
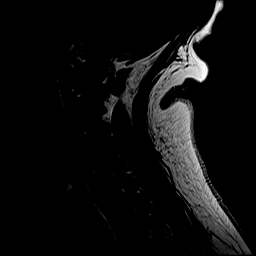
[im 8/15]
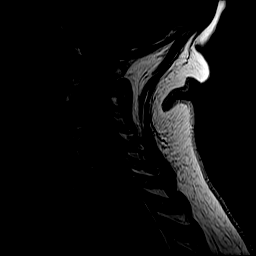
[im 10/15]
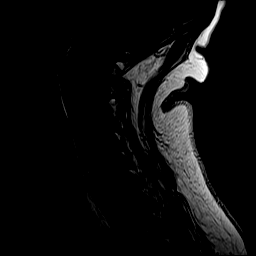
[im 12/15]
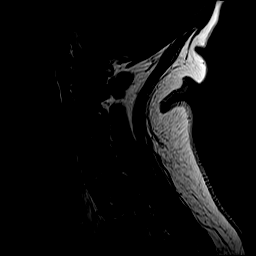
[im 15/15]
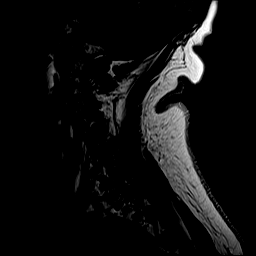

[Series 10: STIR · sagittal · 3.0mm · 0.70mm/px · 8 of 15 slices shown]
[im 1/15]
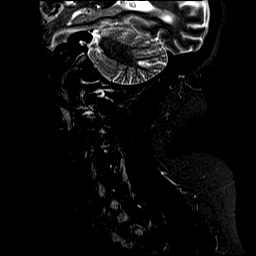
[im 3/15]
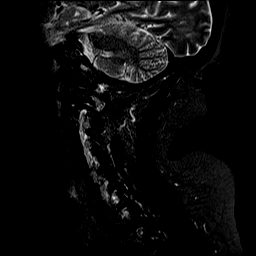
[im 5/15]
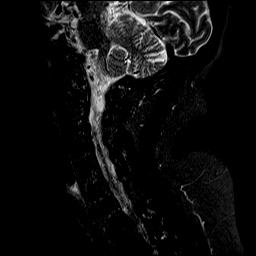
[im 7/15]
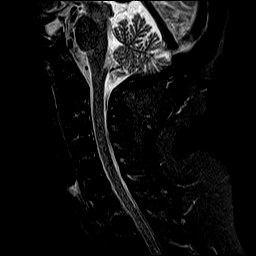
[im 9/15]
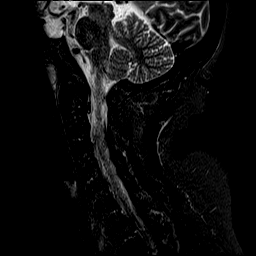
[im 11/15]
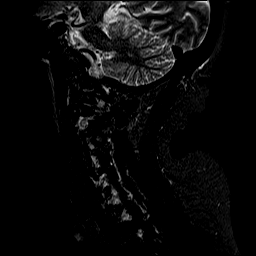
[im 13/15]
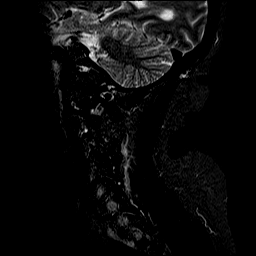
[im 15/15]
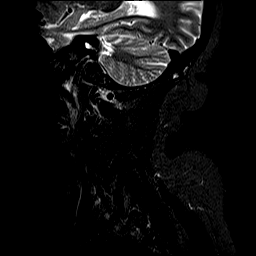

[Series 11: T2 · axial · 3.0mm · 0.70mm/px · z∈[-91,-4]mm · 13 of 25 slices shown (2 of 2)]
[im 1/25]
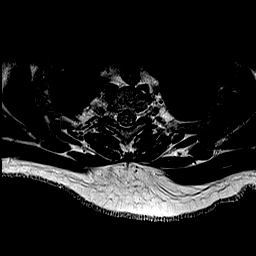
[im 3/25]
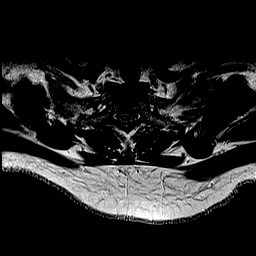
[im 5/25]
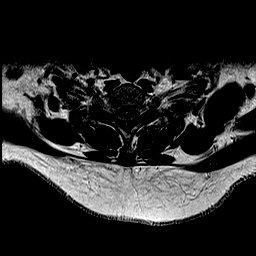
[im 7/25]
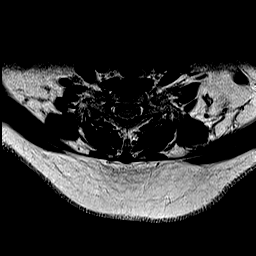
[im 9/25]
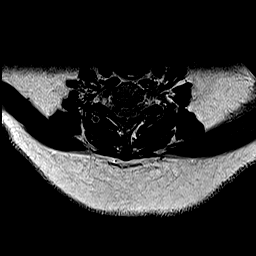
[im 11/25]
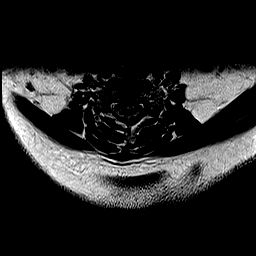
[im 13/25]
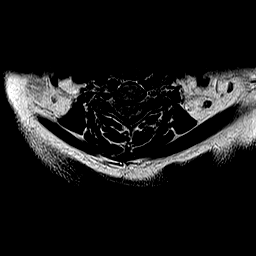
[im 15/25]
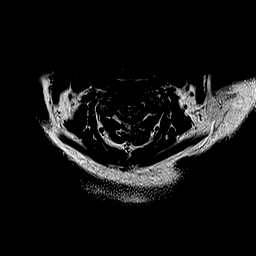
[im 17/25]
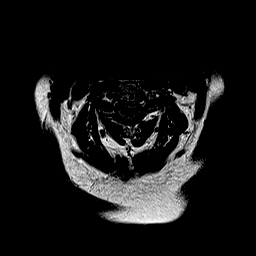
[im 19/25]
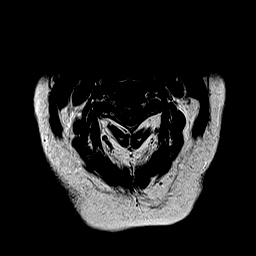
[im 21/25]
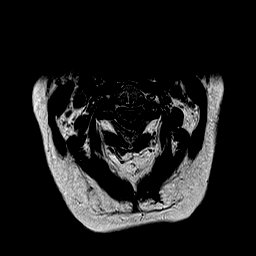
[im 23/25]
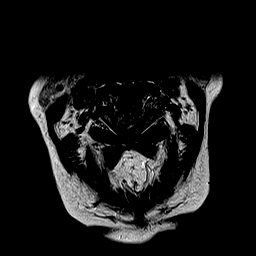
[im 25/25]
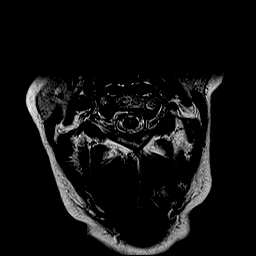

[Series 14: mpgr ax · axial · 3.0mm · 0.35mm/px · z∈[-83,+6]mm · 8 of 25 slices shown]
[im 1/25]
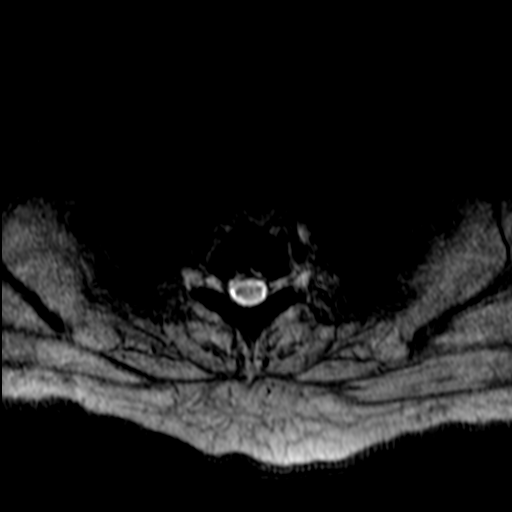
[im 5/25]
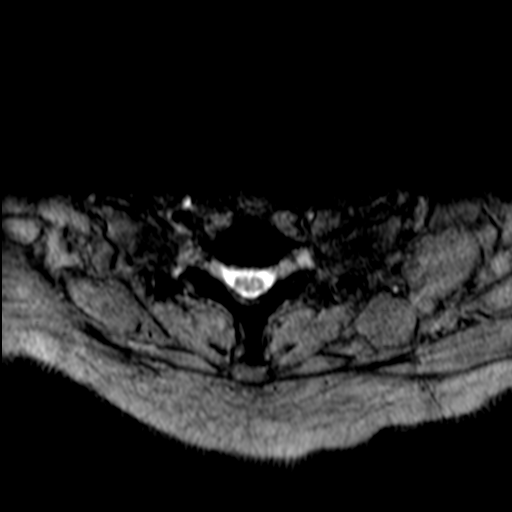
[im 9/25]
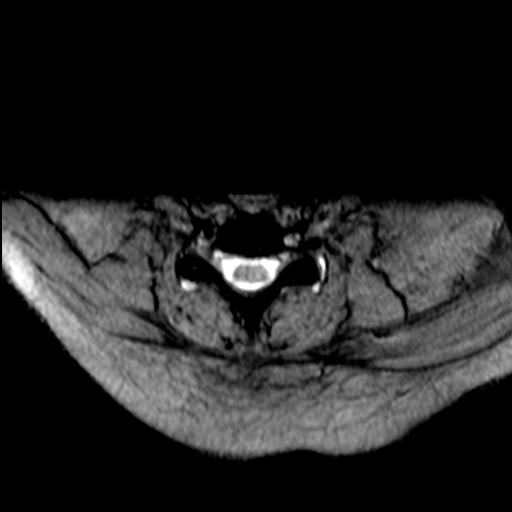
[im 11/25]
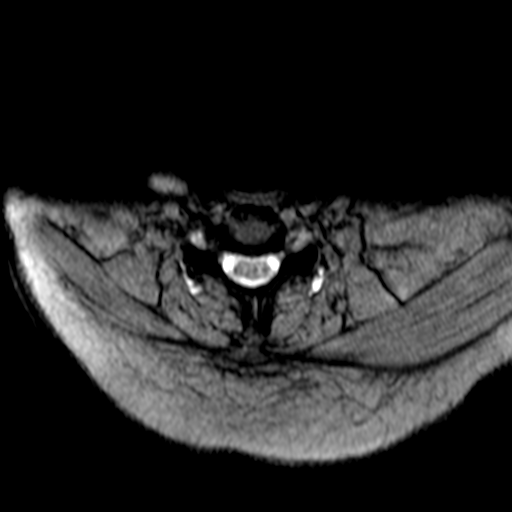
[im 15/25]
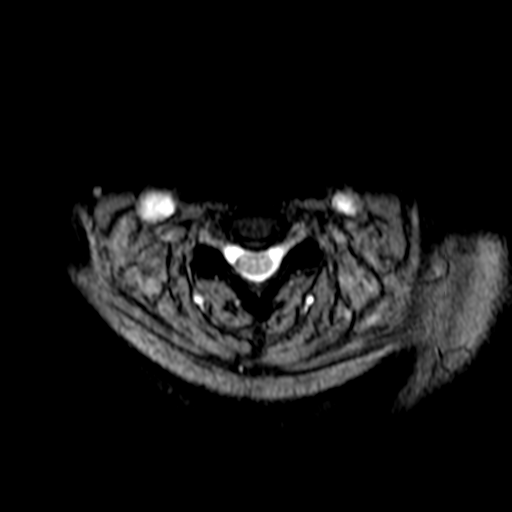
[im 17/25]
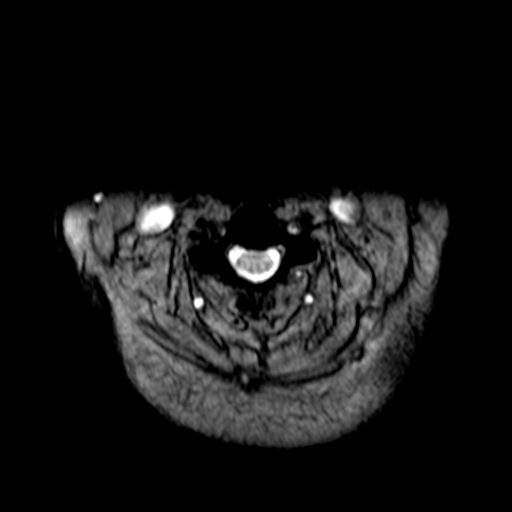
[im 21/25]
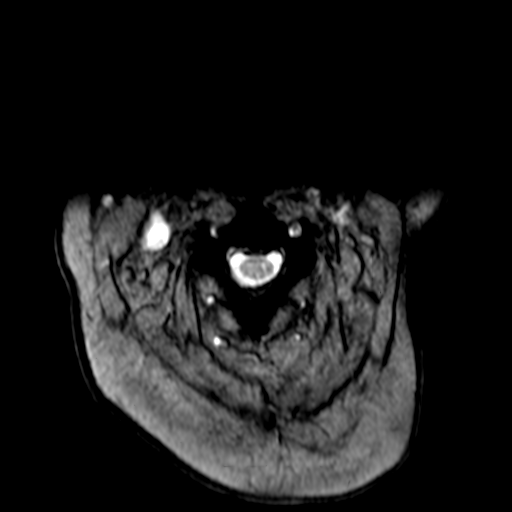
[im 25/25]
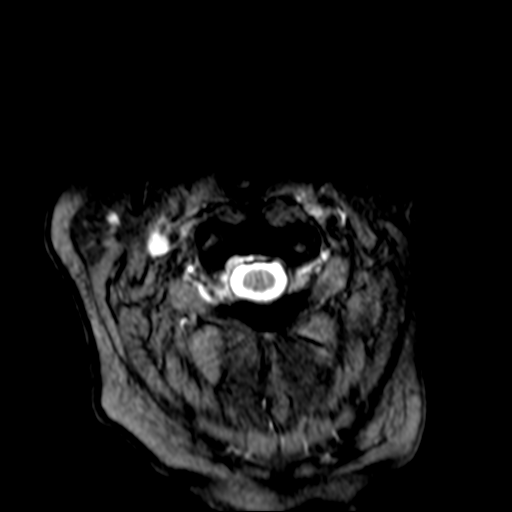

[43 of 48 positions shown; findings below may reference images not displayed]

FINDINGS: Alignment: Mild straightening of the normal cervical lordosis. No
listhesis or malalignment.

Vertebrae: Vertebral body heights well maintained without evidence
for acute or chronic fracture. Bone marrow signal intensity within
normal limits. No discrete or worrisome osseous lesions. No abnormal
marrow edema.

Cord: Signal intensity within the cervical spinal cord is normal.
Cord caliber and morphology are normal.

Posterior Fossa, vertebral arteries, paraspinal tissues: Incidental
note made of an empty sella. Visualized brain and posterior fossa
otherwise unremarkable. Normal cyst noted at the nasopharynx.
Paraspinous and prevertebral soft tissues within normal limits. Left
vertebral artery is hypoplastic with poor flow void, which may be
related to slow flow and/or occlusion. Normal flow void present
within the dominant right vertebral artery.

Disc levels:

C2-C3: Mild right-sided facet hypertrophy. No canal or foraminal
stenosis.

C3-C4: Shallow posterior disc protrusion mildly indents the ventral
thecal sac. No significant canal or foraminal stenosis.

C4-C5: Minimal annular disc bulge with uncovertebral hypertrophy. No
stenosis.

C5-C6: Mild diffuse disc bulge with bilateral uncovertebral
hypertrophy. Mild left-sided facet hypertrophy. No significant canal
stenosis. Mild left C6 foraminal narrowing. No right foraminal
encroachment.

C6-C7: Mild disc bulge with uncovertebral hypertrophy without
stenosis.

C7-T1: Mild disc bulge. Mild bilateral facet hypertrophy. No canal
or foraminal stenosis.

Visualized upper thoracic spine demonstrates no significant finding.
IMPRESSION: 1. No acute finding within the cervical spine. No findings to
explain patient's symptoms identified.
2. Mild for age cervical spondylolysis as above without significant
canal or foraminal stenosis.

## 2019-04-09 DIAGNOSIS — R809 Proteinuria, unspecified: Secondary | ICD-10-CM | POA: Diagnosis not present

## 2019-04-09 DIAGNOSIS — Z1159 Encounter for screening for other viral diseases: Secondary | ICD-10-CM | POA: Diagnosis not present

## 2019-04-09 DIAGNOSIS — E1129 Type 2 diabetes mellitus with other diabetic kidney complication: Secondary | ICD-10-CM | POA: Diagnosis not present

## 2019-04-09 DIAGNOSIS — I10 Essential (primary) hypertension: Secondary | ICD-10-CM | POA: Diagnosis not present

## 2019-04-09 DIAGNOSIS — B369 Superficial mycosis, unspecified: Secondary | ICD-10-CM | POA: Diagnosis not present

## 2019-04-09 DIAGNOSIS — Z79899 Other long term (current) drug therapy: Secondary | ICD-10-CM | POA: Diagnosis not present

## 2019-04-09 DIAGNOSIS — E782 Mixed hyperlipidemia: Secondary | ICD-10-CM | POA: Diagnosis not present

## 2019-08-23 DIAGNOSIS — E782 Mixed hyperlipidemia: Secondary | ICD-10-CM | POA: Diagnosis not present

## 2019-08-23 DIAGNOSIS — Z79899 Other long term (current) drug therapy: Secondary | ICD-10-CM | POA: Diagnosis not present

## 2019-08-23 DIAGNOSIS — F17210 Nicotine dependence, cigarettes, uncomplicated: Secondary | ICD-10-CM | POA: Diagnosis not present

## 2019-08-23 DIAGNOSIS — E119 Type 2 diabetes mellitus without complications: Secondary | ICD-10-CM | POA: Diagnosis not present

## 2019-08-23 DIAGNOSIS — I10 Essential (primary) hypertension: Secondary | ICD-10-CM | POA: Diagnosis not present

## 2019-08-23 DIAGNOSIS — Z0000 Encounter for general adult medical examination without abnormal findings: Secondary | ICD-10-CM | POA: Diagnosis not present

## 2020-03-17 DIAGNOSIS — R600 Localized edema: Secondary | ICD-10-CM | POA: Diagnosis not present

## 2020-03-17 DIAGNOSIS — Z79899 Other long term (current) drug therapy: Secondary | ICD-10-CM | POA: Diagnosis not present

## 2020-03-17 DIAGNOSIS — H6123 Impacted cerumen, bilateral: Secondary | ICD-10-CM | POA: Diagnosis not present

## 2020-03-17 DIAGNOSIS — E782 Mixed hyperlipidemia: Secondary | ICD-10-CM | POA: Diagnosis not present

## 2020-03-17 DIAGNOSIS — E1129 Type 2 diabetes mellitus with other diabetic kidney complication: Secondary | ICD-10-CM | POA: Diagnosis not present

## 2020-03-17 DIAGNOSIS — R809 Proteinuria, unspecified: Secondary | ICD-10-CM | POA: Diagnosis not present

## 2020-03-17 DIAGNOSIS — F17210 Nicotine dependence, cigarettes, uncomplicated: Secondary | ICD-10-CM | POA: Diagnosis not present

## 2020-03-17 DIAGNOSIS — I10 Essential (primary) hypertension: Secondary | ICD-10-CM | POA: Diagnosis not present

## 2020-09-24 DIAGNOSIS — E119 Type 2 diabetes mellitus without complications: Secondary | ICD-10-CM | POA: Diagnosis not present

## 2020-09-24 DIAGNOSIS — H2513 Age-related nuclear cataract, bilateral: Secondary | ICD-10-CM | POA: Diagnosis not present

## 2020-09-24 DIAGNOSIS — H25013 Cortical age-related cataract, bilateral: Secondary | ICD-10-CM | POA: Diagnosis not present

## 2020-09-24 DIAGNOSIS — Z0100 Encounter for examination of eyes and vision without abnormal findings: Secondary | ICD-10-CM | POA: Diagnosis not present

## 2020-09-26 DIAGNOSIS — Z0000 Encounter for general adult medical examination without abnormal findings: Secondary | ICD-10-CM | POA: Diagnosis not present

## 2020-09-26 DIAGNOSIS — E1129 Type 2 diabetes mellitus with other diabetic kidney complication: Secondary | ICD-10-CM | POA: Diagnosis not present

## 2020-09-26 DIAGNOSIS — E782 Mixed hyperlipidemia: Secondary | ICD-10-CM | POA: Diagnosis not present

## 2020-09-26 DIAGNOSIS — Z79899 Other long term (current) drug therapy: Secondary | ICD-10-CM | POA: Diagnosis not present

## 2020-09-26 DIAGNOSIS — F17210 Nicotine dependence, cigarettes, uncomplicated: Secondary | ICD-10-CM | POA: Diagnosis not present

## 2020-09-26 DIAGNOSIS — I10 Essential (primary) hypertension: Secondary | ICD-10-CM | POA: Diagnosis not present

## 2020-09-26 DIAGNOSIS — R809 Proteinuria, unspecified: Secondary | ICD-10-CM | POA: Diagnosis not present

## 2020-09-26 DIAGNOSIS — Z720 Tobacco use: Secondary | ICD-10-CM | POA: Diagnosis not present

## 2021-04-20 DIAGNOSIS — E1129 Type 2 diabetes mellitus with other diabetic kidney complication: Secondary | ICD-10-CM | POA: Diagnosis not present

## 2021-04-20 DIAGNOSIS — Z79899 Other long term (current) drug therapy: Secondary | ICD-10-CM | POA: Diagnosis not present

## 2021-04-20 DIAGNOSIS — E782 Mixed hyperlipidemia: Secondary | ICD-10-CM | POA: Diagnosis not present

## 2021-04-20 DIAGNOSIS — Z720 Tobacco use: Secondary | ICD-10-CM | POA: Diagnosis not present

## 2021-04-20 DIAGNOSIS — I10 Essential (primary) hypertension: Secondary | ICD-10-CM | POA: Diagnosis not present

## 2021-04-20 DIAGNOSIS — R809 Proteinuria, unspecified: Secondary | ICD-10-CM | POA: Diagnosis not present

## 2021-10-27 DIAGNOSIS — E1129 Type 2 diabetes mellitus with other diabetic kidney complication: Secondary | ICD-10-CM | POA: Diagnosis not present

## 2021-10-27 DIAGNOSIS — Z720 Tobacco use: Secondary | ICD-10-CM | POA: Diagnosis not present

## 2021-10-27 DIAGNOSIS — E782 Mixed hyperlipidemia: Secondary | ICD-10-CM | POA: Diagnosis not present

## 2021-10-27 DIAGNOSIS — R809 Proteinuria, unspecified: Secondary | ICD-10-CM | POA: Diagnosis not present

## 2021-10-27 DIAGNOSIS — Z79899 Other long term (current) drug therapy: Secondary | ICD-10-CM | POA: Diagnosis not present

## 2021-10-27 DIAGNOSIS — H6123 Impacted cerumen, bilateral: Secondary | ICD-10-CM | POA: Diagnosis not present

## 2021-10-27 DIAGNOSIS — I10 Essential (primary) hypertension: Secondary | ICD-10-CM | POA: Diagnosis not present

## 2021-10-27 DIAGNOSIS — Z0000 Encounter for general adult medical examination without abnormal findings: Secondary | ICD-10-CM | POA: Diagnosis not present

## 2021-10-27 DIAGNOSIS — Z1389 Encounter for screening for other disorder: Secondary | ICD-10-CM | POA: Diagnosis not present

## 2022-04-26 DIAGNOSIS — E1129 Type 2 diabetes mellitus with other diabetic kidney complication: Secondary | ICD-10-CM | POA: Diagnosis not present

## 2022-04-26 DIAGNOSIS — R809 Proteinuria, unspecified: Secondary | ICD-10-CM | POA: Diagnosis not present

## 2022-04-26 DIAGNOSIS — Z79899 Other long term (current) drug therapy: Secondary | ICD-10-CM | POA: Diagnosis not present

## 2022-04-26 DIAGNOSIS — F17210 Nicotine dependence, cigarettes, uncomplicated: Secondary | ICD-10-CM | POA: Diagnosis not present

## 2022-04-26 DIAGNOSIS — I10 Essential (primary) hypertension: Secondary | ICD-10-CM | POA: Diagnosis not present

## 2022-04-26 DIAGNOSIS — E782 Mixed hyperlipidemia: Secondary | ICD-10-CM | POA: Diagnosis not present

## 2022-08-26 DIAGNOSIS — H6123 Impacted cerumen, bilateral: Secondary | ICD-10-CM | POA: Diagnosis not present

## 2022-10-27 DIAGNOSIS — Z1331 Encounter for screening for depression: Secondary | ICD-10-CM | POA: Diagnosis not present

## 2022-10-27 DIAGNOSIS — E782 Mixed hyperlipidemia: Secondary | ICD-10-CM | POA: Diagnosis not present

## 2022-10-27 DIAGNOSIS — I10 Essential (primary) hypertension: Secondary | ICD-10-CM | POA: Diagnosis not present

## 2022-10-27 DIAGNOSIS — Z79899 Other long term (current) drug therapy: Secondary | ICD-10-CM | POA: Diagnosis not present

## 2022-10-27 DIAGNOSIS — R809 Proteinuria, unspecified: Secondary | ICD-10-CM | POA: Diagnosis not present

## 2022-10-27 DIAGNOSIS — H9193 Unspecified hearing loss, bilateral: Secondary | ICD-10-CM | POA: Diagnosis not present

## 2022-10-27 DIAGNOSIS — Z0000 Encounter for general adult medical examination without abnormal findings: Secondary | ICD-10-CM | POA: Diagnosis not present

## 2022-10-27 DIAGNOSIS — E1129 Type 2 diabetes mellitus with other diabetic kidney complication: Secondary | ICD-10-CM | POA: Diagnosis not present

## 2022-10-27 DIAGNOSIS — F17210 Nicotine dependence, cigarettes, uncomplicated: Secondary | ICD-10-CM | POA: Diagnosis not present

## 2022-11-26 DIAGNOSIS — H90A21 Sensorineural hearing loss, unilateral, right ear, with restricted hearing on the contralateral side: Secondary | ICD-10-CM | POA: Diagnosis not present

## 2022-11-29 ENCOUNTER — Other Ambulatory Visit: Payer: Self-pay | Admitting: Student

## 2022-11-29 DIAGNOSIS — H90A21 Sensorineural hearing loss, unilateral, right ear, with restricted hearing on the contralateral side: Secondary | ICD-10-CM

## 2023-05-11 DIAGNOSIS — H6123 Impacted cerumen, bilateral: Secondary | ICD-10-CM | POA: Diagnosis not present

## 2023-05-11 DIAGNOSIS — I10 Essential (primary) hypertension: Secondary | ICD-10-CM | POA: Diagnosis not present

## 2023-05-11 DIAGNOSIS — R809 Proteinuria, unspecified: Secondary | ICD-10-CM | POA: Diagnosis not present

## 2023-05-11 DIAGNOSIS — E1129 Type 2 diabetes mellitus with other diabetic kidney complication: Secondary | ICD-10-CM | POA: Diagnosis not present

## 2023-05-11 DIAGNOSIS — F17210 Nicotine dependence, cigarettes, uncomplicated: Secondary | ICD-10-CM | POA: Diagnosis not present

## 2023-05-11 DIAGNOSIS — E782 Mixed hyperlipidemia: Secondary | ICD-10-CM | POA: Diagnosis not present

## 2023-05-11 DIAGNOSIS — Z79899 Other long term (current) drug therapy: Secondary | ICD-10-CM | POA: Diagnosis not present

## 2023-05-11 DIAGNOSIS — E119 Type 2 diabetes mellitus without complications: Secondary | ICD-10-CM | POA: Diagnosis not present

## 2023-11-22 DIAGNOSIS — E782 Mixed hyperlipidemia: Secondary | ICD-10-CM | POA: Diagnosis not present

## 2023-11-22 DIAGNOSIS — Z1331 Encounter for screening for depression: Secondary | ICD-10-CM | POA: Diagnosis not present

## 2023-11-22 DIAGNOSIS — Z79899 Other long term (current) drug therapy: Secondary | ICD-10-CM | POA: Diagnosis not present

## 2023-11-22 DIAGNOSIS — E1129 Type 2 diabetes mellitus with other diabetic kidney complication: Secondary | ICD-10-CM | POA: Diagnosis not present

## 2023-11-22 DIAGNOSIS — I10 Essential (primary) hypertension: Secondary | ICD-10-CM | POA: Diagnosis not present

## 2023-11-22 DIAGNOSIS — Z0000 Encounter for general adult medical examination without abnormal findings: Secondary | ICD-10-CM | POA: Diagnosis not present

## 2023-11-22 DIAGNOSIS — F17210 Nicotine dependence, cigarettes, uncomplicated: Secondary | ICD-10-CM | POA: Diagnosis not present

## 2023-11-22 DIAGNOSIS — R809 Proteinuria, unspecified: Secondary | ICD-10-CM | POA: Diagnosis not present

## 2023-11-22 DIAGNOSIS — E119 Type 2 diabetes mellitus without complications: Secondary | ICD-10-CM | POA: Diagnosis not present

## 2024-04-05 DIAGNOSIS — E119 Type 2 diabetes mellitus without complications: Secondary | ICD-10-CM | POA: Diagnosis not present

## 2024-05-30 DIAGNOSIS — Z1331 Encounter for screening for depression: Secondary | ICD-10-CM | POA: Diagnosis not present

## 2024-05-30 DIAGNOSIS — F17210 Nicotine dependence, cigarettes, uncomplicated: Secondary | ICD-10-CM | POA: Diagnosis not present

## 2024-05-30 DIAGNOSIS — Z79899 Other long term (current) drug therapy: Secondary | ICD-10-CM | POA: Diagnosis not present

## 2024-05-30 DIAGNOSIS — E785 Hyperlipidemia, unspecified: Secondary | ICD-10-CM | POA: Diagnosis not present

## 2024-05-30 DIAGNOSIS — E119 Type 2 diabetes mellitus without complications: Secondary | ICD-10-CM | POA: Diagnosis not present

## 2024-05-30 DIAGNOSIS — I10 Essential (primary) hypertension: Secondary | ICD-10-CM | POA: Diagnosis not present
